# Patient Record
Sex: Male | Born: 1965 | Race: White | Hispanic: No | Marital: Married | Smoking: Current every day smoker
Health system: Southern US, Community
[De-identification: ages and names within clinical notes are randomized; demographics above are authoritative.]

## PROBLEM LIST (undated history)

## (undated) DIAGNOSIS — R413 Other amnesia: Secondary | ICD-10-CM

## (undated) DIAGNOSIS — J45909 Unspecified asthma, uncomplicated: Secondary | ICD-10-CM

## (undated) DIAGNOSIS — T7840XA Allergy, unspecified, initial encounter: Secondary | ICD-10-CM

## (undated) DIAGNOSIS — F32A Depression, unspecified: Secondary | ICD-10-CM

## (undated) DIAGNOSIS — G43909 Migraine, unspecified, not intractable, without status migrainosus: Secondary | ICD-10-CM

## (undated) DIAGNOSIS — E785 Hyperlipidemia, unspecified: Secondary | ICD-10-CM

## (undated) DIAGNOSIS — J439 Emphysema, unspecified: Secondary | ICD-10-CM

## (undated) HISTORY — DX: Hyperlipidemia, unspecified: E78.5

## (undated) HISTORY — DX: Depression, unspecified: F32.A

## (undated) HISTORY — DX: Allergy, unspecified, initial encounter: T78.40XA

## (undated) HISTORY — DX: Other amnesia: R41.3

## (undated) HISTORY — PX: OTHER SURGICAL HISTORY: SHX169

## (undated) HISTORY — PX: NASAL SINUS SURGERY: SHX719

## (undated) HISTORY — PX: COLONOSCOPY: SHX174

## (undated) HISTORY — PX: HAND SURGERY: SHX662

## (undated) HISTORY — DX: Emphysema, unspecified: J43.9

## (undated) HISTORY — DX: Migraine, unspecified, not intractable, without status migrainosus: G43.909

## (undated) HISTORY — DX: Unspecified asthma, uncomplicated: J45.909

---

## 2017-11-29 DIAGNOSIS — I639 Cerebral infarction, unspecified: Secondary | ICD-10-CM | POA: Insufficient documentation

## 2017-11-29 HISTORY — DX: Cerebral infarction, unspecified: I63.9

## 2021-12-21 ENCOUNTER — Ambulatory Visit (INDEPENDENT_AMBULATORY_CARE_PROVIDER_SITE_OTHER): Payer: Managed Care, Other (non HMO) | Admitting: Medical

## 2021-12-21 ENCOUNTER — Encounter: Payer: Self-pay | Admitting: Medical

## 2021-12-21 VITALS — BP 137/86 | HR 85 | Temp 96.4°F | Resp 18 | Ht 74.0 in | Wt 248.4 lb

## 2021-12-21 DIAGNOSIS — R4701 Aphasia: Secondary | ICD-10-CM

## 2021-12-21 DIAGNOSIS — Z0000 Encounter for general adult medical examination without abnormal findings: Secondary | ICD-10-CM

## 2021-12-21 DIAGNOSIS — Z125 Encounter for screening for malignant neoplasm of prostate: Secondary | ICD-10-CM | POA: Diagnosis not present

## 2021-12-21 DIAGNOSIS — E785 Hyperlipidemia, unspecified: Secondary | ICD-10-CM

## 2021-12-21 DIAGNOSIS — Z122 Encounter for screening for malignant neoplasm of respiratory organs: Secondary | ICD-10-CM

## 2021-12-21 DIAGNOSIS — R5383 Other fatigue: Secondary | ICD-10-CM

## 2021-12-21 DIAGNOSIS — N529 Male erectile dysfunction, unspecified: Secondary | ICD-10-CM

## 2021-12-21 DIAGNOSIS — F17200 Nicotine dependence, unspecified, uncomplicated: Secondary | ICD-10-CM

## 2021-12-21 DIAGNOSIS — Z8673 Personal history of transient ischemic attack (TIA), and cerebral infarction without residual deficits: Secondary | ICD-10-CM

## 2021-12-21 DIAGNOSIS — G8929 Other chronic pain: Secondary | ICD-10-CM

## 2021-12-21 DIAGNOSIS — R519 Headache, unspecified: Secondary | ICD-10-CM

## 2021-12-21 DIAGNOSIS — E559 Vitamin D deficiency, unspecified: Secondary | ICD-10-CM | POA: Diagnosis not present

## 2021-12-21 DIAGNOSIS — Z1211 Encounter for screening for malignant neoplasm of colon: Secondary | ICD-10-CM

## 2021-12-21 DIAGNOSIS — K2289 Other specified disease of esophagus: Secondary | ICD-10-CM

## 2021-12-21 MED ORDER — ROSUVASTATIN CALCIUM 20 MG PO TABS: 20.0000 mg | ORAL_TABLET | Freq: Every day | ORAL | 3 refills | Status: DC

## 2021-12-21 NOTE — Patient Instructions (Addendum)
For you wellness exam today I have ordered cbc, cmp and lipid panel.  Screening psa.  For fatigue added fatigue labs.  Referral for colonoscopy.  Placed ct chest screening for lung cancer.   Will place neurology referral for history of stoke and for migraine ha.  Refer to cardiologist for CAD and high cholesterol.  Recommend stop smoking.  Recommend exercise and healthy diet.  We will let you know lab results as they come in.  For ED rx'd viagra.  Follow up date appointment will be determined after lab review.     Preventive Care 56-47 Years Old, Male Preventive care refers to lifestyle choices and visits with your health care provider that can promote health and wellness. Preventive care visits are also called wellness exams. What can I expect for my preventive care visit? Counseling During your preventive care visit, your health care provider may ask about your: Medical history, including: Past medical problems. Family medical history. Current health, including: Emotional well-being. Home life and relationship well-being. Sexual activity. Lifestyle, including: Alcohol, nicotine or tobacco, and drug use. Access to firearms. Diet, exercise, and sleep habits. Safety issues such as seatbelt and bike helmet use. Sunscreen use. Work and work Statistician. Physical exam Your health care provider will check your: Height and weight. These may be used to calculate your BMI (body mass index). BMI is a measurement that tells if you are at a healthy weight. Waist circumference. This measures the distance around your waistline. This measurement also tells if you are at a healthy weight and may help predict your risk of certain diseases, such as type 2 diabetes and high blood pressure. Heart rate and blood pressure. Body temperature. Skin for abnormal spots. What immunizations do I need? Vaccines are usually given at various ages, according to a schedule. Your health care  provider will recommend vaccines for you based on your age, medical history, and lifestyle or other factors, such as travel or where you work. What tests do I need? Screening Your health care provider may recommend screening tests for certain conditions. This may include: Lipid and cholesterol levels. Diabetes screening. This is done by checking your blood sugar (glucose) after you have not eaten for a while (fasting). Hepatitis B test. Hepatitis C test. HIV (human immunodeficiency virus) test. STI (sexually transmitted infection) testing, if you are at risk. Lung cancer screening. Prostate cancer screening. Colorectal cancer screening. Talk with your health care provider about your test results, treatment options, and if necessary, the need for more tests. Follow these instructions at home: Eating and drinking  Eat a diet that includes fresh fruits and vegetables, whole grains, lean protein, and low-fat dairy products. Take vitamin and mineral supplements as recommended by your health care provider. Do not drink alcohol if your health care provider tells you not to drink. If you drink alcohol: Limit how much you have to 0-2 drinks a day. Know how much alcohol is in your drink. In the [place] one drink equals one 12 oz bottle of beer (355 mL), one 5 oz glass of wine (148 mL), or one 1 oz glass of hard liquor (44 mL). Lifestyle Brush your teeth every morning and night with fluoride toothpaste. Floss one time each day. Exercise for at least 30 minutes 5 or more days each week. Do not use any products that contain nicotine or tobacco. These products include cigarettes, chewing tobacco, and vaping devices, such as e-cigarettes. If you need help quitting, ask your health care provider. Do not use  drugs. If you are sexually active, practice safe sex. Use a condom or other form of protection to prevent STIs. Take aspirin only as told by your health care provider. Make sure that you understand  how much to take and what form to take. Work with your health care provider to find out whether it is safe and beneficial for you to take aspirin daily. Find healthy ways to manage stress, such as: Meditation, yoga, or listening to music. Journaling. Talking to a trusted person. Spending time with friends and family. Minimize exposure to UV radiation to reduce your risk of skin cancer. Safety Always wear your seat belt while driving or riding in a vehicle. Do not drive: If you have been drinking alcohol. Do not ride with someone who has been drinking. When you are tired or distracted. While texting. If you have been using any mind-altering substances or drugs. Wear a helmet and other protective equipment during sports activities. If you have firearms in your house, make sure you follow all gun safety procedures. What's next? Go to your health care provider once a year for an annual wellness visit. Ask your health care provider how often you should have your eyes and teeth checked. Stay up to date on all vaccines. This information is not intended to replace advice given to you by your health care provider. Make sure you discuss any questions you have with your health care provider. Document Revised: 05/13/2021 Document Reviewed: 05/13/2021 Elsevier Patient Education  [place].

## 2021-12-21 NOTE — Progress Notes (Signed)
Subjective:    Patient ID: Lee Andrews, male    DOB: July 31, 1966, 56 y.o.   MRN: [id number]  HPI  Pt in for first. Pt new to area. Moved from  [place].   Pt in for first time. He has hx of stroke 3 years ago. Pt got ha around that time. He states got migraine like featurs to HA around that time. No migraine ha in youth.  Pt had imaging studies with neurologist in past. He had left basal ganglion area stroke. Pt needs new neurologist. Pt saw neurologist for stroke and  different ha specialist.    Lamonte Sakai that are daily are the same over past 3 years. Tried botox for a while and did not help.  Pt also of nortriptyline for mood and migraine. Pt has no  been out of emgality since oct or November. He had been on emgality 6 months before the move. It did decrease intensity of ha and frequency but still had daily. Still some light and sound sensitivity.  Pt has some left side  hand tremors over 2.5 years or so.  Pt is on disability.   Hyperlipidemia- he on crestor. Pt had cardiologist. He had catherization in the past. Some cad but no need for procdure per wife.   Review of Systems  Constitutional:  Negative for chills, fatigue and fever.  Respiratory:  Negative for chest tightness, shortness of breath and wheezing.   Cardiovascular:  Negative for chest pain and palpitations.  Gastrointestinal:  Negative for abdominal pain.  Genitourinary:  Negative for difficulty urinating and flank pain.       ED. No prior use nitro. No adverse side effects. Wife states he needs refill.  Musculoskeletal:  Negative for back pain and joint swelling.  Skin:  Negative for rash.  Neurological:  Positive for headaches. Negative for dizziness, tremors, syncope, weakness and light-headedness.       Daily ha for years.  Psychiatric/Behavioral:  Positive for dysphoric mood. Negative for confusion, hallucinations and sleep disturbance. The patient is not nervous/anxious.        Mild depression daily ha.    No past  medical history on file.   Social History   Socioeconomic History   Marital status: Married    Spouse name: Not on file   Number of children: Not on file   Years of education: Not on file   Highest education level: Not on file  Occupational History   Not on file  Tobacco Use   Smoking status: Not on file   Smokeless tobacco: Not on file  Substance and Sexual Activity   Alcohol use: Not on file   Drug use: Not on file   Sexual activity: Not on file  Other Topics Concern   Not on file  Social History Narrative   Not on file   Social Determinants of Health   Financial Resource Strain: Not on file  Food Insecurity: Not on file  Transportation Needs: Not on file  Physical Activity: Not on file  Stress: Not on file  Social Connections: Not on file  Intimate Partner Violence: Not on file      No family history on file.  Not on File  Current Outpatient Medications on File Prior to Visit  Medication Sig Dispense Refill   EMGALITY 120 MG/ML SOAJ Inject 1 mL into the skin every 30 (thirty) days.     nortriptyline (PAMELOR) 50 MG capsule Take 50 mg by mouth at bedtime.     rosuvastatin (  CRESTOR) 20 MG tablet Take 20 mg by mouth daily.     No current facility-administered medications on file prior to visit.    BP 137/86    Pulse 85    Temp (!) 96.4 F (35.8 C) (Temporal)    Resp 18    Ht 6\' 2"  (1.88 m)    Wt 248 lb 6.4 oz (112.7 kg)    SpO2 97%    BMI 31.89 kg/m       Objective:   Physical Exam  General Mental Status- Alert. General Appearance- Not in acute distress.   Skin General: Color- Normal Color. Moisture- Normal Moisture.  Neck Carotid Arteries- Normal color. Moisture- Normal Moisture. No carotid bruits. No JVD.  Chest and Lung Exam Auscultation: Breath Sounds:-Normal.  Cardiovascular Auscultation:Rythm- Regular. Murmurs & Other Heart Sounds:Auscultation of the heart reveals- No Murmurs.  Abdomen Inspection:-Inspeection  Normal. Palpation/Percussion:Note:No mass. Palpation and Percussion of the abdomen reveal- Non Tender, Non Distended + BS, no rebound or guarding.    Neurologic Cranial Nerve exam:- CN III-XII intact(No nystagmus), symmetric smile. Drift Test:- No drift. Romberg Exam:- Negative.  Heal to Toe Gait exam:-Normal. Finger to Nose:- Normal/Intact Strength:- 5/5 equal and symmetric strength both upper and lower extremities.   Left upper ext- mild shaking/tremor.     Assessment & Plan:   For you wellness exam today I have ordered cbc, cmp and lipid panel.  Screening psa.  For fatigue added fatigue labs.  Referral for colonoscopy.  Placed ct chest screening for lung cancer.  Will place neurology referral for history of stoke and for migraine ha.  Refer to cardiologist for CAD and high cholesterol.  Recommend stop smoking.  Recommend exercise and healthy diet.  We will let you know lab results as they come in.  Follow up date appointment will be determined after lab review.   Mackie Pai, PA-C

## 2021-12-22 ENCOUNTER — Telehealth: Payer: Self-pay | Admitting: Medical

## 2021-12-22 LAB — LIPID PANEL
Cholesterol: 202 mg/dL — ABNORMAL HIGH (ref 0–200)
HDL: 44.5 mg/dL (ref >39.00)
LDL Cholesterol: 128 mg/dL — ABNORMAL HIGH (ref 0–99)
NonHDL: 157.76
Total CHOL/HDL Ratio: 5
Triglycerides: 148 mg/dL (ref 0.0–149.0)
VLDL: 29.6 mg/dL (ref 0.0–40.0)

## 2021-12-22 LAB — COMPREHENSIVE METABOLIC PANEL
ALT: 23 U/L (ref 0–53)
AST: 18 U/L (ref 0–37)
Albumin: 4.5 g/dL (ref 3.5–5.2)
Alkaline Phosphatase: 82 U/L (ref 39–117)
BUN: 12 mg/dL (ref 6–23)
CO2: 28 mEq/L (ref 19–32)
Calcium: 9.9 mg/dL (ref 8.4–10.5)
Chloride: 101 mEq/L (ref 96–112)
Creatinine, Ser: 0.62 mg/dL (ref 0.40–1.50)
GFR: 107.89 mL/min (ref >60.00)
Glucose, Bld: 82 mg/dL (ref 70–99)
Potassium: 5.2 mEq/L — ABNORMAL HIGH (ref 3.5–5.1)
Sodium: 137 mEq/L (ref 135–145)
Total Bilirubin: 0.6 mg/dL (ref 0.2–1.2)
Total Protein: 7 g/dL (ref 6.0–8.3)

## 2021-12-22 LAB — TSH: TSH: 1.51 u[IU]/mL (ref 0.35–5.50)

## 2021-12-22 LAB — CBC WITH DIFFERENTIAL/PLATELET
Basophils Absolute: 0.1 10*3/uL (ref 0.0–0.1)
Basophils Relative: 0.8 % (ref 0.0–3.0)
Eosinophils Absolute: 0.2 10*3/uL (ref 0.0–0.7)
Eosinophils Relative: 1.6 % (ref 0.0–5.0)
HCT: 46.5 % (ref 39.0–52.0)
Hemoglobin: 15.2 g/dL (ref 13.0–17.0)
Lymphocytes Relative: 22.3 % (ref 12.0–46.0)
Lymphs Abs: 2.5 10*3/uL (ref 0.7–4.0)
MCHC: 32.7 g/dL (ref 30.0–36.0)
MCV: 90.7 fl (ref 78.0–100.0)
Monocytes Absolute: 0.7 10*3/uL (ref 0.1–1.0)
Monocytes Relative: 6.4 % (ref 3.0–12.0)
Neutro Abs: 7.7 10*3/uL (ref 1.4–7.7)
Neutrophils Relative %: 68.9 % (ref 43.0–77.0)
Platelets: 374 10*3/uL (ref 150.0–400.0)
RBC: 5.13 Mil/uL (ref 4.22–5.81)
RDW: 13.9 % (ref 11.5–15.5)
WBC: 11.2 10*3/uL — ABNORMAL HIGH (ref 4.0–10.5)

## 2021-12-22 LAB — VITAMIN B12: Vitamin B-12: 330 pg/mL (ref 211–911)

## 2021-12-22 LAB — T4, FREE: Free T4: 0.94 ng/dL (ref 0.60–1.60)

## 2021-12-22 LAB — PSA: PSA: 0.43 ng/mL (ref 0.10–4.00)

## 2021-12-22 MED ORDER — SILDENAFIL CITRATE 100 MG PO TABS
50.0000 mg | ORAL_TABLET | Freq: Every day | ORAL | 3 refills | Status: DC | PRN
Start: 2021-12-22 — End: 2021-12-22

## 2021-12-22 MED ORDER — SILDENAFIL CITRATE 100 MG PO TABS: 50.0000 mg | ORAL_TABLET | Freq: Every day | ORAL | 3 refills | Status: DC | PRN

## 2021-12-22 NOTE — Addendum Note (Signed)
Addended by: Anabel Halon on: 12/22/2021 11:44 AM   Modules accepted: Orders

## 2021-12-22 NOTE — Addendum Note (Signed)
Addended by: Jeronimo Greaves on: 12/22/2021 01:08 PM   Modules accepted: Orders

## 2021-12-22 NOTE — Telephone Encounter (Signed)
Printed off and placed up  front 

## 2021-12-22 NOTE — Addendum Note (Signed)
Addended by: Anabel Halon on: 12/22/2021 12:00 PM   Modules accepted: Orders

## 2021-12-22 NOTE — Telephone Encounter (Signed)
Various attempts to print. Mackie Pai, PA-C

## 2021-12-22 NOTE — Telephone Encounter (Signed)
I have tried to print pt viagra rx 3 times or more. Will you print the rx and give for me to sign. Then after signed place up front alphebetical for pt to pick up.

## 2021-12-24 LAB — VITAMIN D 1,25 DIHYDROXY
Vitamin D 1, 25 (OH)2 Total: 39 pg/mL (ref 18–72)
Vitamin D2 1, 25 (OH)2: <8 pg/mL
Vitamin D3 1, 25 (OH)2: 39 pg/mL

## 2021-12-25 LAB — VITAMIN B1: Vitamin B1 (Thiamine): 8 nmol/L (ref 8–30)

## 2021-12-30 ENCOUNTER — Telehealth: Payer: Self-pay | Admitting: Medical

## 2021-12-30 NOTE — Telephone Encounter (Signed)
Medical records copied and placed in scan , original copies placed at front desk

## 2021-12-30 NOTE — Telephone Encounter (Signed)
Patient spouse dropped off medical records to be scanned into patients chart.  Patient spouse will pick up original copies once completed.  Placed inside providers tray.

## 2022-01-04 ENCOUNTER — Telehealth: Payer: Self-pay | Admitting: Diagnostic Neuroimaging

## 2022-01-04 ENCOUNTER — Ambulatory Visit (INDEPENDENT_AMBULATORY_CARE_PROVIDER_SITE_OTHER): Payer: Managed Care, Other (non HMO) | Admitting: Diagnostic Neuroimaging

## 2022-01-04 ENCOUNTER — Encounter: Payer: Self-pay | Admitting: Diagnostic Neuroimaging

## 2022-01-04 VITALS — BP 134/84 | HR 86 | Ht 74.0 in | Wt 250.0 lb

## 2022-01-04 DIAGNOSIS — R259 Unspecified abnormal involuntary movements: Secondary | ICD-10-CM

## 2022-01-04 DIAGNOSIS — R4189 Other symptoms and signs involving cognitive functions and awareness: Secondary | ICD-10-CM | POA: Diagnosis not present

## 2022-01-04 DIAGNOSIS — G43109 Migraine with aura, not intractable, without status migrainosus: Secondary | ICD-10-CM

## 2022-01-04 MED ORDER — AJOVY 225 MG/1.5ML ~~LOC~~ SOAJ: 225.0000 mg | SUBCUTANEOUS | 6 refills | Status: DC

## 2022-01-04 MED ORDER — UBRELVY 50 MG PO TABS: 50.0000 mg | ORAL_TABLET | ORAL | 6 refills | Status: DC | PRN

## 2022-01-04 NOTE — Telephone Encounter (Signed)
cigna order sent to GI, they will obtain the auth and reach out to the patient to schedule.
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
## 2022-01-04 NOTE — Patient Instructions (Signed)
°  APHASIA, APRAXIA, OCULOMOTOR DYSFUNCTION, DYSTONIA - possible corticobasal degeneration - consider trial of carb/levo  HEADACHES (migraine with aura) - ajovy + Roselyn Meier

## 2022-01-05 ENCOUNTER — Other Ambulatory Visit: Payer: Self-pay

## 2022-01-05 ENCOUNTER — Telehealth: Payer: Self-pay

## 2022-01-05 ENCOUNTER — Ambulatory Visit (HOSPITAL_BASED_OUTPATIENT_CLINIC_OR_DEPARTMENT_OTHER)
Admission: RE | Admit: 2022-01-05 | Discharge: 2022-01-05 | Disposition: A | Discharge status: Home / Self Care | Payer: Managed Care, Other (non HMO) | Source: Ambulatory Visit | Attending: Medical | Admitting: Medical

## 2022-01-05 DIAGNOSIS — Z122 Encounter for screening for malignant neoplasm of respiratory organs: Secondary | ICD-10-CM | POA: Insufficient documentation

## 2022-01-05 NOTE — Telephone Encounter (Signed)
I have submitted a PA request for [place] [facility], [place] - PA Case ID: [id number]. Awaiting determination.

## 2022-01-06 ENCOUNTER — Encounter: Payer: Self-pay | Admitting: *Deleted

## 2022-01-06 ENCOUNTER — Telehealth: Payer: Self-pay | Admitting: *Deleted

## 2022-01-06 NOTE — Telephone Encounter (Signed)
Ajovy PA, key [id number], [id number]. have sent my chart asking patient what migraine meds he has tried/ failed. Only emgality  is listed.

## 2022-01-07 NOTE — Addendum Note (Signed)
Addended by: Anabel Halon on: 01/07/2022 01:39 PM   Modules accepted: Orders

## 2022-01-07 NOTE — Telephone Encounter (Signed)
I entered information re: botox that patient had provided.  Your information has been submitted and will be reviewed by [place].  An electronic determination will be received in CoverMyMeds within 72-120 hours. You will receive a fax copy of the determination. If Christella Scheuermann has not responded in 120 hours, contact [employer] at [phone].

## 2022-01-11 ENCOUNTER — Encounter: Payer: Self-pay | Admitting: *Deleted

## 2022-01-11 NOTE — Telephone Encounter (Signed)
Called [employer]a to check Ajovy PA, on hold 20 minutes, will have to call back.

## 2022-01-11 NOTE — Telephone Encounter (Signed)
[employer], on hold 20 minutes. I sent my chart asking patient to have pharmacy run Rx to see if it'll go through.

## 2022-01-13 NOTE — Telephone Encounter (Signed)
Opened in error

## 2022-01-15 DIAGNOSIS — E785 Hyperlipidemia, unspecified: Secondary | ICD-10-CM | POA: Insufficient documentation

## 2022-01-15 DIAGNOSIS — G43909 Migraine, unspecified, not intractable, without status migrainosus: Secondary | ICD-10-CM | POA: Insufficient documentation

## 2022-01-19 ENCOUNTER — Encounter: Payer: Self-pay | Admitting: Cardiology

## 2022-01-19 ENCOUNTER — Ambulatory Visit (INDEPENDENT_AMBULATORY_CARE_PROVIDER_SITE_OTHER): Payer: Managed Care, Other (non HMO) | Admitting: Cardiology

## 2022-01-19 ENCOUNTER — Other Ambulatory Visit: Payer: Self-pay

## 2022-01-19 VITALS — BP 118/80 | HR 91 | Ht 74.0 in | Wt 247.0 lb

## 2022-01-19 DIAGNOSIS — E782 Mixed hyperlipidemia: Secondary | ICD-10-CM

## 2022-01-19 DIAGNOSIS — F17200 Nicotine dependence, unspecified, uncomplicated: Secondary | ICD-10-CM | POA: Diagnosis not present

## 2022-01-19 DIAGNOSIS — I6930 Unspecified sequelae of cerebral infarction: Secondary | ICD-10-CM

## 2022-01-19 DIAGNOSIS — I2510 Atherosclerotic heart disease of native coronary artery without angina pectoris: Secondary | ICD-10-CM | POA: Diagnosis not present

## 2022-01-19 DIAGNOSIS — R079 Chest pain, unspecified: Secondary | ICD-10-CM

## 2022-01-19 DIAGNOSIS — I2584 Coronary atherosclerosis due to calcified coronary lesion: Secondary | ICD-10-CM

## 2022-01-19 NOTE — Patient Instructions (Signed)
Medication Instructions:  Your physician recommends that you continue on your current medications as directed. Please refer to the Current Medication list given to you today.  *If you need a refill on your cardiac medications before your next appointment, please call your pharmacy*   Lab Work: Your physician recommends that you return for lab work in:   Labs in 1 month: Lipids  If you have labs (blood work) drawn today and your tests are completely normal, you will receive your results only by: [place] (if you have [place]) OR A paper copy in the mail If you have any lab test that is abnormal or we need to change your treatment, we will call you to review the results.   Testing/Procedures: Your physician has requested that you have an echocardiogram. Echocardiography is a painless test that uses sound waves to create images of your heart. It provides your doctor with information about the size and shape of your heart and how well your hearts chambers and valves are working. This procedure takes approximately one hour. There are no restrictions for this procedure.  Your physician has requested that you have a carotid duplex. This test is an ultrasound of the carotid arteries in your neck. It looks at blood flow through these arteries that supply the brain with blood. Allow one hour for this exam. There are no restrictions or special instructions.     [facility] at [facility] [address] [place], [place] [address] Phone: [phone]    Please arrive 15 minutes prior to your appointment time for registration and insurance purposes.  The test will take approximately 3 to 4 hours to complete; you may bring reading material.  If someone comes with you to your appointment, they will need to remain in the main lobby due to limited space in the testing area. **If you are pregnant or breastfeeding, please notify the nuclear lab prior to  your appointment**  How to prepare for your Myocardial Perfusion Test: Do not eat or drink 3 hours prior to your test, except you may have water. Do not consume products containing caffeine (regular or decaffeinated) 12 hours prior to your test. (ex: coffee, chocolate, sodas, tea). Do bring a list of your current medications with you.  If not listed below, you may take your medications as normal. HOLD Erectile dysfunction medication: Viagra Do wear comfortable clothes (no dresses or overalls) and walking shoes, tennis shoes preferred (No heels or open toe shoes are allowed). Do NOT wear cologne, perfume, aftershave, or lotions (deodorant is allowed). If these instructions are not followed, your test will have to be rescheduled.  Please report to [place], [address] for your test.  If you have questions or concerns about your appointment, you can call the Nuclear Lab at [phone].  If you cannot keep your appointment, please provide 24 hours notification to the Nuclear Lab, to avoid a possible $50 charge to your account.    Follow-Up: At [facility], you and your health needs are our priority.  As part of our continuing mission to provide you with exceptional heart care, we have created designated Provider Care Teams.  These Care Teams include your primary Cardiologist (physician) and Advanced Practice Providers (APPs -  Physician Assistants and Nurse Practitioners) who all work together to provide you with the care you need, when you need it.  We recommend signing up for the patient portal called "MyChart".  Sign up information is provided on this  After Visit Summary.  MyChart is used to connect with patients for Virtual Visits (Telemedicine).  Patients are able to view lab/test results, encounter notes, upcoming appointments, etc.  Non-urgent messages can be sent to your provider as well.   To learn more about what you can do with MyChart, go to [email or web address].     Your next appointment:   3 month(s)  The format for your next appointment:   In Person  Provider:   Jenne Campus, MD    Other Instructions None

## 2022-01-19 NOTE — Progress Notes (Signed)
d 
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
## 2022-01-20 ENCOUNTER — Ambulatory Visit
Admission: RE | Admit: 2022-01-20 | Discharge: 2022-01-20 | Disposition: A | Discharge status: Home / Self Care | Payer: Managed Care, Other (non HMO) | Source: Ambulatory Visit | Attending: Diagnostic Neuroimaging | Admitting: Diagnostic Neuroimaging

## 2022-01-20 ENCOUNTER — Telehealth (HOSPITAL_COMMUNITY): Payer: Self-pay

## 2022-01-20 DIAGNOSIS — R4189 Other symptoms and signs involving cognitive functions and awareness: Secondary | ICD-10-CM

## 2022-01-20 DIAGNOSIS — R259 Unspecified abnormal involuntary movements: Secondary | ICD-10-CM

## 2022-01-20 MED ORDER — GADOBENATE DIMEGLUMINE 529 MG/ML IV SOLN
20.0000 mL | Freq: Once | INTRAVENOUS | Status: AC | PRN
  Administered 2022-01-20: 20 mL via INTRAVENOUS

## 2022-01-20 NOTE — Telephone Encounter (Signed)
Spoke with the patient, detailed instructions given. He stated that he would be here for his test. Asked to call back with any questions. S.Lezley Bedgood EMTP 

## 2022-01-22 ENCOUNTER — Other Ambulatory Visit: Payer: Self-pay

## 2022-01-22 ENCOUNTER — Ambulatory Visit (HOSPITAL_BASED_OUTPATIENT_CLINIC_OR_DEPARTMENT_OTHER)
Admission: RE | Admit: 2022-01-22 | Discharge: 2022-01-22 | Disposition: A | Discharge status: Home / Self Care | Payer: Managed Care, Other (non HMO) | Source: Ambulatory Visit | Attending: Cardiology | Admitting: Cardiology

## 2022-01-22 DIAGNOSIS — I6930 Unspecified sequelae of cerebral infarction: Secondary | ICD-10-CM | POA: Insufficient documentation

## 2022-01-22 DIAGNOSIS — R079 Chest pain, unspecified: Secondary | ICD-10-CM | POA: Diagnosis present

## 2022-01-22 DIAGNOSIS — I2584 Coronary atherosclerosis due to calcified coronary lesion: Secondary | ICD-10-CM | POA: Diagnosis present

## 2022-01-22 DIAGNOSIS — E782 Mixed hyperlipidemia: Secondary | ICD-10-CM | POA: Insufficient documentation

## 2022-01-22 DIAGNOSIS — I2510 Atherosclerotic heart disease of native coronary artery without angina pectoris: Secondary | ICD-10-CM | POA: Insufficient documentation

## 2022-01-22 DIAGNOSIS — F17200 Nicotine dependence, unspecified, uncomplicated: Secondary | ICD-10-CM | POA: Insufficient documentation

## 2022-01-22 LAB — ECHOCARDIOGRAM COMPLETE
AV Mean grad: 3 mmHg
AV Peak grad: 5.5 mmHg
Ao pk vel: 1.17 m/s
Area-P 1/2: 2.68 cm2
S' Lateral: 3.3 cm

## 2022-01-22 NOTE — Progress Notes (Signed)
°  Echocardiogram 2D Echocardiogram has been performed.  Lee Andrews F 01/22/2022, 11:12 AM

## 2022-01-25 NOTE — Telephone Encounter (Addendum)
Lee Andrews denied, must try triptans. Patient has history of stroke, cannot take triptans. PA will be resubmitted.

## 2022-01-25 NOTE — Telephone Encounter (Signed)
PA resubmission was successful received instant approval.  This request has been approved.  Please note any additional information provided by [employer] at the bottom of your screen. [id number];Review Type:Prior Auth;Coverage Start Date:01/25/2022;Coverage End Date:01/25/2023;

## 2022-01-26 ENCOUNTER — Ambulatory Visit (HOSPITAL_COMMUNITY): Payer: Managed Care, Other (non HMO) | Attending: Cardiovascular Disease

## 2022-01-26 ENCOUNTER — Other Ambulatory Visit: Payer: Self-pay

## 2022-01-26 DIAGNOSIS — R079 Chest pain, unspecified: Secondary | ICD-10-CM | POA: Insufficient documentation

## 2022-01-26 LAB — MYOCARDIAL PERFUSION IMAGING
LV dias vol: 74 mL (ref 62–150)
LV sys vol: 24 mL
Nuc Stress EF: 67 %
Peak HR: 94 {beats}/min
Rest HR: 78 {beats}/min
Rest Nuclear Isotope Dose: 10.2 mCi
SDS: 1
SRS: 0
SSS: 1
ST Depression (mm): 0 mm
Stress Nuclear Isotope Dose: 31.1 mCi
TID: 1.04

## 2022-01-26 MED ORDER — TECHNETIUM TC 99M TETROFOSMIN IV KIT
31.1000 | PACK | Freq: Once | INTRAVENOUS | Status: AC | PRN
  Administered 2022-01-26: 31.1 via INTRAVENOUS
  Filled 2022-01-26: qty 32

## 2022-01-26 MED ORDER — TECHNETIUM TC 99M TETROFOSMIN IV KIT
10.2000 | PACK | Freq: Once | INTRAVENOUS | Status: AC | PRN
  Administered 2022-01-26: 10.2 via INTRAVENOUS
  Filled 2022-01-26: qty 11

## 2022-01-26 MED ORDER — REGADENOSON 0.4 MG/5ML IV SOLN
0.4000 mg | Freq: Once | INTRAVENOUS | Status: AC
  Administered 2022-01-26: 0.4 mg via INTRAVENOUS

## 2022-01-28 ENCOUNTER — Telehealth: Payer: Self-pay

## 2022-01-28 NOTE — Telephone Encounter (Signed)
-----   Message from Park Liter, MD sent at 01/25/2022  2:21 PM EST ----- ?Echocardiogram showed normal left ventricle ejection fraction, left atrium is moderately dilated, overall looks good ?

## 2022-01-28 NOTE — Telephone Encounter (Signed)
Patient notified of results.

## 2022-01-28 NOTE — Telephone Encounter (Signed)
-----   Message from Park Liter, MD sent at 01/28/2022 10:52 AM EST ----- ?Stress test showing no ischemia ?

## 2022-02-05 ENCOUNTER — Other Ambulatory Visit: Payer: Self-pay

## 2022-02-05 ENCOUNTER — Ambulatory Visit (AMBULATORY_SURGERY_CENTER): Payer: Managed Care, Other (non HMO) | Admitting: *Deleted

## 2022-02-05 VITALS — Ht 74.0 in | Wt 250.0 lb

## 2022-02-05 DIAGNOSIS — Z1211 Encounter for screening for malignant neoplasm of colon: Secondary | ICD-10-CM

## 2022-02-05 MED ORDER — NA SULFATE-K SULFATE-MG SULF 17.5-3.13-1.6 GM/177ML PO SOLN: 1.0000 | Freq: Once | ORAL | 0 refills | Status: AC

## 2022-02-05 NOTE — Progress Notes (Signed)
No egg or soy allergy known to patient  °No issues known to pt with past sedation with any surgeries or procedures °Patient denies ever being told they had issues or difficulty with intubation  °No FH of Malignant Hyperthermia °Pt is not on diet pills °Pt is not on  home 02  °Pt is not on blood thinners  °Pt denies issues with constipation  °No A fib or A flutter ° °Pt is not vaccinated  for Covid  ° °Due to the COVID-19 pandemic we are asking patients to follow certain guidelines in PV and the [facility]   °Pt aware of COVID protocols and [facility] guidelines  ° °PV completed over the phone. Pt verified name, DOB, address and insurance during PV today.  °Pt mailed instruction packet with copy of consent form to read and not return, and instructions.  ° °Pt encouraged to call with questions or issues.  °If pt has My chart, procedure instructions sent via My Chart  ° °

## 2022-02-10 ENCOUNTER — Encounter: Payer: Self-pay | Admitting: Gastroenterology

## 2022-02-16 ENCOUNTER — Other Ambulatory Visit: Payer: Self-pay

## 2022-02-16 ENCOUNTER — Ambulatory Visit (HOSPITAL_COMMUNITY)
Admission: RE | Admit: 2022-02-16 | Discharge: 2022-02-16 | Disposition: A | Discharge status: Home / Self Care | Payer: Managed Care, Other (non HMO) | Source: Ambulatory Visit | Attending: Cardiovascular Disease | Admitting: Cardiovascular Disease

## 2022-02-16 DIAGNOSIS — Z8673 Personal history of transient ischemic attack (TIA), and cerebral infarction without residual deficits: Secondary | ICD-10-CM

## 2022-02-16 DIAGNOSIS — I6930 Unspecified sequelae of cerebral infarction: Secondary | ICD-10-CM | POA: Insufficient documentation

## 2022-02-16 DIAGNOSIS — I2584 Coronary atherosclerosis due to calcified coronary lesion: Secondary | ICD-10-CM | POA: Insufficient documentation

## 2022-02-16 DIAGNOSIS — F17200 Nicotine dependence, unspecified, uncomplicated: Secondary | ICD-10-CM | POA: Diagnosis present

## 2022-02-16 DIAGNOSIS — E782 Mixed hyperlipidemia: Secondary | ICD-10-CM | POA: Insufficient documentation

## 2022-02-16 DIAGNOSIS — R079 Chest pain, unspecified: Secondary | ICD-10-CM | POA: Diagnosis present

## 2022-02-16 DIAGNOSIS — I2510 Atherosclerotic heart disease of native coronary artery without angina pectoris: Secondary | ICD-10-CM | POA: Insufficient documentation

## 2022-02-19 ENCOUNTER — Encounter: Payer: Self-pay | Admitting: Gastroenterology

## 2022-02-19 ENCOUNTER — Ambulatory Visit (AMBULATORY_SURGERY_CENTER): Payer: Managed Care, Other (non HMO) | Admitting: Gastroenterology

## 2022-02-19 VITALS — BP 110/76 | HR 61 | Temp 98.3°F | Resp 12 | Ht 74.0 in | Wt 247.0 lb

## 2022-02-19 DIAGNOSIS — Z1211 Encounter for screening for malignant neoplasm of colon: Secondary | ICD-10-CM | POA: Diagnosis present

## 2022-02-19 DIAGNOSIS — D128 Benign neoplasm of rectum: Secondary | ICD-10-CM | POA: Diagnosis not present

## 2022-02-19 DIAGNOSIS — D123 Benign neoplasm of transverse colon: Secondary | ICD-10-CM

## 2022-02-19 DIAGNOSIS — D125 Benign neoplasm of sigmoid colon: Secondary | ICD-10-CM

## 2022-02-19 DIAGNOSIS — D120 Benign neoplasm of cecum: Secondary | ICD-10-CM | POA: Diagnosis not present

## 2022-02-19 DIAGNOSIS — D122 Benign neoplasm of ascending colon: Secondary | ICD-10-CM

## 2022-02-19 MED ORDER — SODIUM CHLORIDE 0.9 % IV SOLN: 500.0000 mL | INTRAVENOUS | Status: DC

## 2022-02-19 NOTE — Progress Notes (Signed)
PT taken to PACU. Monitors in place. VSS. Report given to RN. 
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
## 2022-02-19 NOTE — Progress Notes (Signed)
Called to room to assist during endoscopic procedure.  Patient ID and intended procedure confirmed with present staff. Received instructions for my participation in the procedure from the performing physician.  

## 2022-02-19 NOTE — Op Note (Signed)
[place] ?Patient Name: Lee Andrews ?Procedure Date: 02/19/2022 9:38 AM ?MRN: [id number] ?Endoscopist: Jackquline Denmark , MD ?Age: 56 ?Referring MD:  ?Date of Birth: 02/09/1966 ?Gender: Male ?Account #: [id number] ?Procedure:                Colonoscopy ?Indications:              Screening for colorectal malignant neoplasm ?Medicines:                Monitored Anesthesia Care ?Procedure:                Pre-Anesthesia Assessment: ?                          - Prior to the procedure, a History and Physical  ?                          was performed, and patient medications and  ?                          allergies were reviewed. The patient's tolerance of  ?                          previous anesthesia was also reviewed. The risks  ?                          and benefits of the procedure and the sedation  ?                          options and risks were discussed with the patient.  ?                          All questions were answered, and informed consent  ?                          was obtained. Prior Anticoagulants: The patient has  ?                          taken no previous anticoagulant or antiplatelet  ?                          agents. ASA Grade Assessment: II - A patient with  ?                          mild systemic disease. After reviewing the risks  ?                          and benefits, the patient was deemed in  ?                          satisfactory condition to undergo the procedure. ?                          After obtaining informed consent, the colonoscope  ?  was passed under direct vision. Throughout the  ?                          procedure, the patient's blood pressure, pulse, and  ?                          oxygen saturations were monitored continuously. The  ?                          CF [id number] #[id number] was introduced through the anus  ?                          and advanced to the the cecum, identified by  ?                          appendiceal orifice and  ileocecal valve. The  ?                          colonoscopy was performed without difficulty. The  ?                          patient tolerated the procedure well. The quality  ?                          of the bowel preparation was adequate to identify  ?                          polyps. Some retained stool in the colon. TI, IC  ?                          valve and rectum was photographed. ?Scope In: 9:42:35 AM ?Scope Out: 10:09:17 AM ?Scope Withdrawal Time: 0 hours 19 minutes 35 seconds  ?Total Procedure Duration: 0 hours 26 minutes 42 seconds  ?Findings:                 Five sessile polyps were found in the proximal  ?                          transverse colon (2, 1 cm each), proximal ascending  ?                          colon (2 polyps, 1.2 cm each, flat laterally  ?                          spreading, s/p underwater EMR using cold snare) and  ?                          cecum (1). The polyps were 6 to 12 mm in size.  ?                          These polyps were removed with a cold snare.  ?  Resection and retrieval were complete. ?                          Three sessile polyps were found in the rectum and  ?                          mid sigmoid colon. The polyps were 4 to 6 mm in  ?                          size. These polyps were removed with a cold snare.  ?                          Resection and retrieval were complete. Few small  ?                          hyperplastic appearing polyps were visualized in  ?                          the distal sigmoid colon and rectum, confirmed by  ?                          NBI. ?                          A few small-mouthed diverticula were found in the  ?                          sigmoid colon. ?                          Non-bleeding internal hemorrhoids were found during  ?                          retroflexion. The hemorrhoids were small and Grade  ?                          I (internal hemorrhoids that do not prolapse). ?Complications:             No immediate complications. ?Estimated Blood Loss:     Estimated blood loss: none. ?Impression:               - Five 6 to 12 mm polyps in the proximal transverse  ?                          colon, in the proximal ascending colon (s/p  ?                          underwater EMR) and in the cecum, removed with a  ?                          cold snare. Resected and retrieved. ?                          - Three 4 to 6 mm polyps in the rectum and in  the  ?                          mid sigmoid colon, removed with a cold snare.  ?                          Resected and retrieved. ?                          - Minimal sigmoid diverticulosis. ?                          - Non-bleeding internal hemorrhoids. ?Recommendation:           - Patient has a contact number available for  ?                          emergencies. The signs and symptoms of potential  ?                          delayed complications were discussed with the  ?                          patient. Return to normal activities tomorrow.  ?                          Written discharge instructions were provided to the  ?                          patient. ?                          - Resume previous diet. ?                          - Continue present medications. ?                          - Await pathology results. ?                          - No aspirin, ibuprofen, naproxen, or other  ?                          non-steroidal anti-inflammatory drugs for 5 days. ?                          - Repeat colonoscopy in 6 months- 1 year for  ?                          surveillance with 2-day prep. ?                          - The findings and recommendations were discussed  ?                          with the patient's family. ?Jackquline Denmark, MD ?02/19/2022 10:17:51 AM ?This  report has been signed electronically. ?

## 2022-02-19 NOTE — Patient Instructions (Signed)
Resume previous diet and medications. Awaiting pathology results. No aspirin, ibuprofen, naproxen, or other non-steroidal anti-inflammatory drugs for 5 days. ?Repeat Colonoscopy in 6 months-1 year for surveillance with a 2 day prep. ? ?YOU HAD AN ENDOSCOPIC PROCEDURE TODAY AT [facility]:   Refer to the procedure report that was given to you for any specific questions about what was found during the examination.  If the procedure report does not answer your questions, please call your gastroenterologist to clarify.  If you requested that your care partner not be given the details of your procedure findings, then the procedure report has been included in a sealed envelope for you to review at your convenience later. ? ?YOU SHOULD EXPECT: Some feelings of bloating in the abdomen. Passage of more gas than usual.  Walking can help get rid of the air that was put into your GI tract during the procedure and reduce the bloating. If you had a lower endoscopy (such as a colonoscopy or flexible sigmoidoscopy) you may notice spotting of blood in your stool or on the toilet paper. If you underwent a bowel prep for your procedure, you may not have a normal bowel movement for a few days. ? ?Please Note:  You might notice some irritation and congestion in your nose or some drainage.  This is from the oxygen used during your procedure.  There is no need for concern and it should clear up in a day or so. ? ?SYMPTOMS TO REPORT IMMEDIATELY: ? ?Following lower endoscopy (colonoscopy or flexible sigmoidoscopy): ? Excessive amounts of blood in the stool ? Significant tenderness or worsening of abdominal pains ? Swelling of the abdomen that is new, acute ? Fever of 100?F or higher ? ?For urgent or emergent issues, a gastroenterologist can be reached at any hour by calling [phone]. ?Do not use MyChart messaging for urgent concerns.  ? ? ?DIET:  We do recommend a small meal at first, but then you may proceed to your  regular diet.  Drink plenty of fluids but you should avoid alcoholic beverages for 24 hours. ? ?ACTIVITY:  You should plan to take it easy for the rest of today and you should NOT DRIVE or use heavy machinery until tomorrow (because of the sedation medicines used during the test).   ? ?FOLLOW UP: ?Our staff will call the number listed on your records 48-72 hours following your procedure to check on you and address any questions or concerns that you may have regarding the information given to you following your procedure. If we do not reach you, we will leave a message.  We will attempt to reach you two times.  During this call, we will ask if you have developed any symptoms of COVID 19. If you develop any symptoms (ie: fever, flu-like symptoms, shortness of breath, cough etc.) before then, please call [phone].  If you test positive for Covid 19 in the 2 weeks post procedure, please call and report this information to [place].   ? ?If any biopsies were taken you will be contacted by phone or by letter within the next 1-3 weeks.  Please call us at [phone] if you have not heard about the biopsies in 3 weeks.  ? ? ?SIGNATURES/CONFIDENTIALITY: ?You and/or your care partner have signed paperwork which will be entered into your electronic medical record.  These signatures attest to the fact that that the information above on your After Visit Summary has been reviewed and is understood.  Full  responsibility of the confidentiality of this discharge information lies with you and/or your care-partner.  ?

## 2022-02-23 ENCOUNTER — Telehealth: Payer: Self-pay

## 2022-02-23 NOTE — Telephone Encounter (Signed)
Left message on answering machine. 

## 2022-02-25 ENCOUNTER — Encounter: Payer: Self-pay | Admitting: Gastroenterology

## 2022-02-25 ENCOUNTER — Telehealth: Payer: Self-pay

## 2022-02-25 NOTE — Telephone Encounter (Signed)
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

## 2022-02-25 NOTE — Telephone Encounter (Signed)
Unable to reach the patient, as a last attempt\, a letter requesting a call back has been mailed.  ?

## 2022-03-02 ENCOUNTER — Telehealth: Payer: Self-pay | Admitting: Cardiology

## 2022-03-02 NOTE — Telephone Encounter (Signed)
Results reviewed with [name] per DPR as per Dr. Wendy Poet note.  [name] verbalized understanding and had no additional questions. ?Routed to PCP  ?

## 2022-03-02 NOTE — Telephone Encounter (Signed)
Pt's wife calling to get test results. Please advise ?

## 2022-03-12 ENCOUNTER — Ambulatory Visit (INDEPENDENT_AMBULATORY_CARE_PROVIDER_SITE_OTHER): Payer: Managed Care, Other (non HMO) | Admitting: Medical

## 2022-03-12 VITALS — BP 115/74 | HR 75 | Resp 18 | Ht 74.0 in | Wt 253.6 lb

## 2022-03-12 DIAGNOSIS — E875 Hyperkalemia: Secondary | ICD-10-CM

## 2022-03-12 DIAGNOSIS — R911 Solitary pulmonary nodule: Secondary | ICD-10-CM

## 2022-03-12 DIAGNOSIS — I6930 Unspecified sequelae of cerebral infarction: Secondary | ICD-10-CM | POA: Diagnosis not present

## 2022-03-12 DIAGNOSIS — E785 Hyperlipidemia, unspecified: Secondary | ICD-10-CM | POA: Diagnosis not present

## 2022-03-12 DIAGNOSIS — R4701 Aphasia: Secondary | ICD-10-CM | POA: Diagnosis not present

## 2022-03-12 DIAGNOSIS — R4189 Other symptoms and signs involving cognitive functions and awareness: Secondary | ICD-10-CM

## 2022-03-12 DIAGNOSIS — Z8673 Personal history of transient ischemic attack (TIA), and cerebral infarction without residual deficits: Secondary | ICD-10-CM | POA: Diagnosis not present

## 2022-03-12 DIAGNOSIS — K219 Gastro-esophageal reflux disease without esophagitis: Secondary | ICD-10-CM

## 2022-03-12 DIAGNOSIS — J432 Centrilobular emphysema: Secondary | ICD-10-CM

## 2022-03-12 DIAGNOSIS — R933 Abnormal findings on diagnostic imaging of other parts of digestive tract: Secondary | ICD-10-CM

## 2022-03-12 DIAGNOSIS — G249 Dystonia, unspecified: Secondary | ICD-10-CM

## 2022-03-12 DIAGNOSIS — R482 Apraxia: Secondary | ICD-10-CM

## 2022-03-12 DIAGNOSIS — R251 Tremor, unspecified: Secondary | ICD-10-CM

## 2022-03-12 DIAGNOSIS — G43109 Migraine with aura, not intractable, without status migrainosus: Secondary | ICD-10-CM

## 2022-03-12 LAB — COMPREHENSIVE METABOLIC PANEL
ALT: 18 U/L (ref 0–53)
AST: 16 U/L (ref 0–37)
Albumin: 4.5 g/dL (ref 3.5–5.2)
Alkaline Phosphatase: 80 U/L (ref 39–117)
BUN: 10 mg/dL (ref 6–23)
CO2: 29 mEq/L (ref 19–32)
Calcium: 9.6 mg/dL (ref 8.4–10.5)
Chloride: 100 mEq/L (ref 96–112)
Creatinine, Ser: 0.64 mg/dL (ref 0.40–1.50)
GFR: 106.69 mL/min (ref >60.00)
Glucose, Bld: 89 mg/dL (ref 70–99)
Potassium: 5.4 mEq/L — ABNORMAL HIGH (ref 3.5–5.1)
Sodium: 135 mEq/L (ref 135–145)
Total Bilirubin: 0.4 mg/dL (ref 0.2–1.2)
Total Protein: 6.8 g/dL (ref 6.0–8.3)

## 2022-03-12 LAB — LIPID PANEL
Cholesterol: 121 mg/dL (ref 0–200)
HDL: 46.8 mg/dL (ref >39.00)
LDL Cholesterol: 54 mg/dL (ref 0–99)
NonHDL: 74.12
Total CHOL/HDL Ratio: 3
Triglycerides: 103 mg/dL (ref 0.0–149.0)
VLDL: 20.6 mg/dL (ref 0.0–40.0)
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
## 2022-03-14 NOTE — Patient Instructions (Addendum)
Copd by CT and recommended to repeat ct in 6 months. I do think best to refer you to pulmonologist for further evaluation and treatment. PFT studies. ? ? ?Gerd with abnormal thickening of esophagus on CT report. Referral  to GI  MD placed.  ? ?Hyperlipidemia- continue crestor. Lipid panel order placed. ? ?For  daily moderate to severe ha follow up with neurologist. Let neurologist know that meds he has prescribed are not working well.  ? ?APHASIA, APRAXIA, OCULOMOTOR DYSFUNCTION, DYSTONIA (since 2019) ?- possible corticobasal degeneration or other neurodegenerative process ?- left putamen stroke does not explain constellation of symptoms ?- consider trial of carb/levo ? ? ?Hx of stroke with persisting deficits. This along with other chronic problems support his permanent disability. I will fill out forms but also giving copies of the forms for neurologist to fill out as well.  ? ?Follow up 3 months or sooner if needed. ?

## 2022-03-17 ENCOUNTER — Encounter: Payer: Self-pay | Admitting: Medical

## 2022-03-23 ENCOUNTER — Telehealth: Payer: Self-pay

## 2022-03-23 ENCOUNTER — Other Ambulatory Visit (HOSPITAL_BASED_OUTPATIENT_CLINIC_OR_DEPARTMENT_OTHER): Payer: Self-pay

## 2022-03-23 MED ORDER — SODIUM POLYSTYRENE SULFONATE 15 GM/60ML PO SUSP
ORAL | 0 refills | Status: DC
  Filled 2022-03-23: qty 240, 4d supply, fill #0

## 2022-03-23 NOTE — Telephone Encounter (Signed)
Pt wife called stating kayexalate was never sent in ? ? ?They want the medication sent to [facility] on [address] ?

## 2022-03-23 NOTE — Telephone Encounter (Signed)
[facility] only has the generic brand , which comes in a powder or suspension  ? ?Made wife aware that [facility] doesn't carry medication and that she can pick the script up tomorrow at [facility] ?

## 2022-03-23 NOTE — Addendum Note (Signed)
Addended by: Anabel Halon on: 03/23/2022 03:24 PM ? ? Modules accepted: Orders ? ?

## 2022-03-23 NOTE — Addendum Note (Signed)
Addended by: Anabel Halon on: 03/23/2022 03:29 PM ? ? Modules accepted: Orders ? ?

## 2022-03-24 ENCOUNTER — Other Ambulatory Visit (HOSPITAL_BASED_OUTPATIENT_CLINIC_OR_DEPARTMENT_OTHER): Payer: Self-pay

## 2022-03-31 ENCOUNTER — Encounter: Payer: Self-pay | Admitting: Emergency Medicine

## 2022-03-31 ENCOUNTER — Ambulatory Visit (INDEPENDENT_AMBULATORY_CARE_PROVIDER_SITE_OTHER): Payer: Managed Care, Other (non HMO) | Admitting: Emergency Medicine

## 2022-03-31 VITALS — BP 138/80 | HR 75 | Temp 97.5°F | Ht 74.0 in | Wt 252.2 lb

## 2022-03-31 DIAGNOSIS — R911 Solitary pulmonary nodule: Secondary | ICD-10-CM

## 2022-03-31 DIAGNOSIS — J449 Chronic obstructive pulmonary disease, unspecified: Secondary | ICD-10-CM | POA: Diagnosis not present

## 2022-03-31 DIAGNOSIS — F17200 Nicotine dependence, unspecified, uncomplicated: Secondary | ICD-10-CM | POA: Diagnosis not present

## 2022-03-31 DIAGNOSIS — R918 Other nonspecific abnormal finding of lung field: Secondary | ICD-10-CM

## 2022-03-31 NOTE — Assessment & Plan Note (Signed)
Noted on lung cancer screening CT from 01/05/2022.  He is from [place], consider coccidiomycosis.  No documented history of valley fever.  Recommendation was a repeat CT in August 2023 and we will arrange for this. ?

## 2022-03-31 NOTE — Progress Notes (Signed)
? ?Subjective:  ? ? Patient ID: Lee Andrews, male    DOB: 18-Sep-1966, 56 y.o.   MRN: [id number] ? ?HPI ?56 year old man with a history of tobacco use (50 pack years) 1.5-2 pk/day, seasonal allergies, childhood asthma, migraines, history of CVA.  He is referred today for evaluation of emphysema, pulmonary nodular disease noted on lung cancer screening CT chest done 01/05/2022.  ? ?Moved from [place] in the last year. He has been experiencing more nasal congestion, drainage. Lots more cough and throat clearing - clear mucous. Has not needed any BD since he was a child. He gets SOB with exertion, inclines, stairs. He can intermittently hear wheeze. Gets bronchitis frequently.   ? ?Low-dose CT chest 01/05/2022 reviewed by me showed centrilobular and paraseptal emphysema with changes consistent with bronchiolitis, pulmonary nodules that measure up to 7.4 mm in the inferior lateral right lower lobe.  This was read as a lung RADS 3 study with plans to repeat in 6 months (August 2023) ? ? ?Review of Systems ?As per HPI ? ? ?Past Medical History:  ?Diagnosis Date  ? Allergy   ? SEASONAL  ? Asthma   ? "younger grew out of it"  ? Depression   ? "SOMETIMES"  ? Emphysema of lung ([place])   ? Hyperlipidemia   ? Migraine   ? Stroke [facility]) 2019  ?  ? ?Family History  ?Problem Relation Age of Onset  ? Heart disease Mother   ? Colon cancer Neg Hx   ? Colon polyps Neg Hx   ? Esophageal cancer Neg Hx   ? Rectal cancer Neg Hx   ? Stomach cancer Neg Hx   ?  ? ?Social History  ? ?Socioeconomic History  ? Marital status: Married  ?  Spouse name: [name]  ? Number of children: Not on file  ? Years of education: Not on file  ? Highest education level: Some college, no degree  ?Occupational History  ? Not on file  ?Tobacco Use  ? Smoking status: Every Day  ?  Packs/day: 1.00  ?  Years: 35.00  ?  Pack years: 35.00  ?  Types: Cigarettes  ?  Passive exposure: Never  ? Smokeless tobacco: Never  ? Tobacco comments:  ?  1.5 packs smoked daily ARJ 03/31/22   ?Vaping Use  ? Vaping Use: Never used  ?Substance and Sexual Activity  ? Alcohol use: Yes  ?  Comment: 6 pack every 2 weeks spreadout,BEER  ? Drug use: Never  ? Sexual activity: Yes  ?Other Topics Concern  ? Not on file  ?Social History Narrative  ? Lives with wife  ? Caffeine- coffee, 2L diet pepsi  ? ?Social Determinants of Health  ? ?Financial Resource Strain: Not on file  ?Food Insecurity: Not on file  ?Transportation Needs: Not on file  ?Physical Activity: Not on file  ?Stress: Not on file  ?Social Connections: Not on file  ?Intimate Partner Violence: Not on file  ?  ?From [place].  ?Has worked Architect.  ?Had a swamp cooler growing up, then [facility] ? ? ?No Known Allergies  ? ?Outpatient Medications Prior to Visit  ?Medication Sig Dispense Refill  ? aspirin 325 MG tablet Take 325 mg by mouth daily.    ? chlorhexidine (PERIDEX) 0.12 % solution 15 mLs 2 (two) times daily.    ? Fremanezumab-vfrm (AJOVY) 225 MG/1.5ML SOAJ Inject 225 mg into the skin every 30 (thirty) days. 1.68 mL 6  ? ibuprofen (ADVIL) 800 MG tablet Take 800  mg by mouth 3 (three) times daily.    ? nortriptyline (PAMELOR) 50 MG capsule Take 50 mg by mouth at bedtime.    ? rosuvastatin (CRESTOR) 20 MG tablet Take 1 tablet (20 mg total) by mouth daily. 90 tablet 3  ? sildenafil (VIAGRA) 100 MG tablet Take 0.5-1 tablets (50-100 mg total) by mouth daily as needed for erectile dysfunction. 10 tablet 3  ? sodium polystyrene (KAYEXALATE) 15 GM/60ML suspension Take 60 ml by mouth daily for 4 days. 240 mL 0  ? traMADol (ULTRAM) 50 MG tablet Take 50 mg by mouth every 8 (eight) hours as needed.    ? Ubrogepant (UBRELVY) 50 MG TABS Take 50 mg by mouth as needed. May repeat x 1 tab after 2 hours; max 2 tabs per day or 8 per month 8 tablet 6  ? ?No facility-administered medications prior to visit.  ? ? ? ? ?   ?Objective:  ? Physical Exam ?Vitals:  ? 03/31/22 1037  ?BP: 138/80  ?Pulse: 75  ?Temp: (!) 97.5 ?F (36.4 ?C)  ?TempSrc: Oral  ?SpO2: 99%  ?Weight: 252 lb 3.2  oz (114.4 kg)  ?Height: '6\' 2"'$  (1.88 m)  ? ?Gen: Pleasant, well-nourished, in no distress,  normal affect ? ?ENT: No lesions,  mouth clear,  oropharynx clear, no postnasal drip ? ?Neck: No JVD, no stridor ? ?Lungs: No use of accessory muscles, no crackles or wheezing on normal respiration, no wheeze on forced expiration ? ?Cardiovascular: RRR, heart sounds normal, no murmur or gallops, no peripheral edema ? ?Musculoskeletal: No deformities, no cyanosis or clubbing ? ?Neuro: alert, awake, non focal ? ?Skin: Warm, no lesions or rash ? ? ?   ?Assessment & Plan:  ?Pulmonary nodules ?Noted on lung cancer screening CT from 01/05/2022.  He is from [place], consider coccidiomycosis.  No documented history of valley fever.  Recommendation was a repeat CT in August 2023 and we will arrange for this. ? ?COPD (chronic obstructive pulmonary disease) ([place]) ?Based on his history tobacco use, emphysematous change on CT, bronchiolitis on CT, history of exacerbations in the past.  He does have daily symptoms.  We will quantify his degree of obstruction with pulmonary function testing.  Check an alpha-1 antitrypsin.  We will plan to start him on bronchodilator therapy next visit. ? ?Smoking ?Discussed strategy for cutting down, cessation with him today.  For starters we will try to decrease number down to a reasonable level from which he may be able to set a quit date.  We agreed that he will get down to 30 cigarettes daily by our next visit. ? ? ?Baltazar Apo, MD, PhD ?03/31/2022, 1:50 PM ?[facility] ?[phone] or if no answer before 7:00PM call [phone] ?For any issues after 7:00PM please call eLink [phone] ? ? ?

## 2022-03-31 NOTE — Assessment & Plan Note (Signed)
Discussed strategy for cutting down, cessation with him today.  For starters we will try to decrease number down to a reasonable level from which he may be able to set a quit date.  We agreed that he will get down to 30 cigarettes daily by our next visit. ?

## 2022-03-31 NOTE — Patient Instructions (Signed)
We will plan to repeat your lung cancer screening CT in August 2023 to compare with priors. ?We will perform pulmonary function testing at your next office visit. ?Suspect you would benefit from initiating inhaled medication.  We will talk more about this next time after your PFT. ?Blood work today ?You will benefit from decreasing your cigarettes.  Ultimate goal will be to stop altogether.  We agreed today that you would get down to no more than 30 cigarettes daily.  We can talk about decreasing further at your follow-up. ?Follow with Dr. Lamonte Sakai next available with full pulmonary function testing on the same day.  ? ?

## 2022-03-31 NOTE — Assessment & Plan Note (Signed)
Based on his history tobacco use, emphysematous change on CT, bronchiolitis on CT, history of exacerbations in the past.  He does have daily symptoms.  We will quantify his degree of obstruction with pulmonary function testing.  Check an alpha-1 antitrypsin.  We will plan to start him on bronchodilator therapy next visit. ?

## 2022-04-01 ENCOUNTER — Other Ambulatory Visit (INDEPENDENT_AMBULATORY_CARE_PROVIDER_SITE_OTHER): Payer: Managed Care, Other (non HMO)

## 2022-04-01 DIAGNOSIS — E875 Hyperkalemia: Secondary | ICD-10-CM | POA: Diagnosis not present

## 2022-04-01 LAB — COMPREHENSIVE METABOLIC PANEL
ALT: 20 U/L (ref 0–53)
AST: 15 U/L (ref 0–37)
Albumin: 4.6 g/dL (ref 3.5–5.2)
Alkaline Phosphatase: 79 U/L (ref 39–117)
BUN: 11 mg/dL (ref 6–23)
CO2: 29 mEq/L (ref 19–32)
Calcium: 9.8 mg/dL (ref 8.4–10.5)
Chloride: 102 mEq/L (ref 96–112)
Creatinine, Ser: 0.73 mg/dL (ref 0.40–1.50)
GFR: 102.5 mL/min (ref >60.00)
Glucose, Bld: 97 mg/dL (ref 70–99)
Potassium: 5.7 mEq/L — ABNORMAL HIGH (ref 3.5–5.1)
Sodium: 138 mEq/L (ref 135–145)
Total Bilirubin: 0.5 mg/dL (ref 0.2–1.2)
Total Protein: 7.1 g/dL (ref 6.0–8.3)

## 2022-04-02 ENCOUNTER — Other Ambulatory Visit: Payer: Managed Care, Other (non HMO)

## 2022-04-05 ENCOUNTER — Telehealth: Payer: Self-pay

## 2022-04-05 NOTE — Telephone Encounter (Signed)
Lee Andrews's wife called back in regards on 04/01/22 and patient did take KAYEXALATE ,   ? ?Wants to know what to do since patient's sodium increased even with medication ? ? ? ? ? ? ?[phone] ?

## 2022-04-06 NOTE — Telephone Encounter (Signed)
Pt wife called and lvm to return call ?

## 2022-04-07 ENCOUNTER — Other Ambulatory Visit: Payer: Self-pay | Admitting: Diagnostic Neuroimaging

## 2022-04-07 ENCOUNTER — Telehealth (INDEPENDENT_AMBULATORY_CARE_PROVIDER_SITE_OTHER): Payer: Managed Care, Other (non HMO) | Admitting: Medical

## 2022-04-07 ENCOUNTER — Other Ambulatory Visit (HOSPITAL_BASED_OUTPATIENT_CLINIC_OR_DEPARTMENT_OTHER): Payer: Self-pay

## 2022-04-07 DIAGNOSIS — Z8673 Personal history of transient ischemic attack (TIA), and cerebral infarction without residual deficits: Secondary | ICD-10-CM

## 2022-04-07 DIAGNOSIS — G3185 Corticobasal degeneration: Secondary | ICD-10-CM

## 2022-04-07 DIAGNOSIS — I6389 Other cerebral infarction: Secondary | ICD-10-CM

## 2022-04-07 DIAGNOSIS — E875 Hyperkalemia: Secondary | ICD-10-CM | POA: Diagnosis not present

## 2022-04-07 MED ORDER — SODIUM POLYSTYRENE SULFONATE 15 GM/60ML PO SUSP
15.0000 g | Freq: Every day | ORAL | 0 refills | Status: DC
  Filled 2022-04-07: qty 240, 4d supply, fill #0

## 2022-04-07 NOTE — Progress Notes (Signed)
? ?Subjective:  ? ? Patient ID: Lee Andrews, male    DOB: 1965/12/10, 56 y.o.   MRN: [id number] ? ?HPI ?Virtual Visit via Video Note ? ?I connected with Lee Andrews on 04/07/22 at  4:00 PM EDT by a video enabled telemedicine application and verified that I am speaking with the correct person using two identifiers. ? ?Location: ?Patient: home ?Provider: office ?  ?I discussed the limitations of evaluation and management by telemedicine and the availability of in person appointments. The patient expressed understanding and agreed to proceed. ? ?History of Present Illness: ? ? ?Pt with wife today on video visit. Recent high potassium levels. In the past he had borderline potassium levels in the past in [place]. Usually in past 4.9 and 5.1.  ? ?On review he states not difficult stick. No hemolysis on recent labs.  ? ?On review 2 cups of coffee a day.. ? ?His most recent pstassium was 5.7. This was despite use of kayexalate for 5 days. No obvious high potassium foods. Does drink 2-3 cups of coffee a day. Occasional will bing drink for one day only. He did do this prior to most recent repeat cmp/k level. ? ?No renal disease on review.  ? ?Discussed patient's disability form and and would need to refer to neurology or neuropsychologist to fill out the form. ? ? ?  ?Observations/Objective: ?General-no acute distress, pleasant, oriented. ?Lungs- on inspection lungs appear unlabored. ?Neck- no tracheal deviation or jvd on inspection. ?Neuro- gross motor function appears intact.  ? ?Assessment and Plan: ? ?Patient Instructions  ?Hyperkalemia on recent labs.  On review no suspicious foods, medications or chronic medical conditions that would lead to high potassium.  Unusual that potassium did not go down while using Kayexalate.  Instead potassium increased.  Considering possible hemolysis though that was not noted on lab report. ? ?Recommend restarting 4-day prescription of Kayexalate.  Make sure will well-hydrated on day of repeat  future lab and cut down caffeine consumption as well as no heavy alcohol use prior to repeat labs. ? ?Asking to get scheduled for repeat metabolic panel/potassium level on 04/16/2022. ? ?Discussed disability form and need to refer to specialist to fill out the form. ? ? ? ?  ?Follow Up Instructions: ? ?  ?I discussed the assessment and treatment plan with the patient. The patient was provided an opportunity to ask questions and all were answered. The patient agreed with the plan and demonstrated an understanding of the instructions. ?  ?The patient was advised to call back or seek an in-person evaluation if the symptoms worsen or if the condition fails to improve as anticipated. ? ? ? ? ?Mackie Pai, PA-C  ? ?Review of Systems ? ?   ?Objective:  ? Physical Exam ? ? ? ? ?   ?Assessment & Plan:  ? ?Patient Instructions  ?Hyperkalemia on recent labs.  On review no suspicious foods, medications or chronic medical conditions that would lead to high potassium.  Unusual that potassium did not go down while using Kayexalate.  Instead potassium increased.  Considering possible hemolysis though that was not noted on lab report. ? ?Recommend restarting 4-day prescription of Kayexalate.  Make sure will well-hydrated on day of repeat future lab and cut down caffeine consumption as well as no heavy alcohol use prior to repeat labs. ? ?Asking to get scheduled for repeat metabolic panel/potassium level on 04/16/2022. ? ?  ? ?Time spent with patient today was 33  minutes which consisted of chart review,  discussing diagnosis, work up ,treatment, answering questions and documentation.  ?

## 2022-04-07 NOTE — Progress Notes (Signed)
Orders Placed This Encounter  ?Procedures  ? Ambulatory referral to Neuropsychology  ? ?Needs referral for cognitive evaluation. ? ? ?Penni Bombard, MD 2/80/0349, 1:79 PM ?Certified in Neurology, Neurophysiology and Neuroimaging ? ?[facility] ?[address], [address] ?[place], [place] [address] ?[phone] ? ?

## 2022-04-07 NOTE — Addendum Note (Signed)
Addended by: Anabel Halon on: 04/07/2022 05:37 PM ? ? Modules accepted: Orders ? ?

## 2022-04-07 NOTE — Telephone Encounter (Signed)
Appt scheduled

## 2022-04-08 ENCOUNTER — Other Ambulatory Visit (HOSPITAL_BASED_OUTPATIENT_CLINIC_OR_DEPARTMENT_OTHER): Payer: Self-pay

## 2022-04-08 NOTE — Patient Instructions (Addendum)
Hyperkalemia on recent labs.  On review no suspicious foods, medications or chronic medical conditions that would lead to high potassium.  Unusual that potassium did not go down while using Kayexalate.  Instead potassium increased.  Considering possible hemolysis though that was not noted on lab report. ? ?Recommend restarting 4-day prescription of Kayexalate.  Make sure will well-hydrated on day of repeat future lab and cut down caffeine consumption as well as no heavy alcohol use prior to repeat labs. ? ?Asking to get scheduled for repeat metabolic panel/potassium level on 04/16/2022. ? ?History of stroke and subsequent disability. Discussed disability form and need to refer to specialist to fill out the form. ? ? ? ? ?

## 2022-04-09 ENCOUNTER — Other Ambulatory Visit (HOSPITAL_BASED_OUTPATIENT_CLINIC_OR_DEPARTMENT_OTHER): Payer: Self-pay

## 2022-04-09 LAB — ALPHA-1 ANTITRYPSIN PHENOTYPE: A-1 Antitrypsin, Ser: 194 mg/dL (ref 83–199)

## 2022-04-12 ENCOUNTER — Encounter: Payer: Self-pay | Admitting: Gastroenterology

## 2022-04-12 ENCOUNTER — Ambulatory Visit (INDEPENDENT_AMBULATORY_CARE_PROVIDER_SITE_OTHER): Payer: Managed Care, Other (non HMO) | Admitting: Gastroenterology

## 2022-04-12 VITALS — BP 124/82 | HR 67 | Ht 74.0 in | Wt 253.0 lb

## 2022-04-12 DIAGNOSIS — Z860100 Personal history of colon polyps, unspecified: Secondary | ICD-10-CM

## 2022-04-12 DIAGNOSIS — K219 Gastro-esophageal reflux disease without esophagitis: Secondary | ICD-10-CM

## 2022-04-12 DIAGNOSIS — R9389 Abnormal findings on diagnostic imaging of other specified body structures: Secondary | ICD-10-CM

## 2022-04-12 MED ORDER — PANTOPRAZOLE SODIUM 40 MG PO TBEC: 40.0000 mg | DELAYED_RELEASE_TABLET | Freq: Every day | ORAL | 3 refills | Status: DC
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
## 2022-04-12 NOTE — Patient Instructions (Addendum)
If you are age 56 or older, your body mass index should be between 23-30. Your Body mass index is 32.48 kg/m?Marland Kitchen If this is out of the aforementioned range listed, please consider follow up with your Primary Care Provider. ? ?If you are age 5 or younger, your body mass index should be between 19-25. Your Body mass index is 32.48 kg/m?Marland Kitchen If this is out of the aformentioned range listed, please consider follow up with your Primary Care Provider.  ? ?________________________________________________________ ? ?The [facility] providers would like to encourage you to use [facility] to communicate with providers for non-urgent requests or questions.  Due to long hold times on the telephone, sending your provider a message by [facility] may be a faster and more efficient way to get a response.  Please allow 48 business hours for a response.  Please remember that this is for non-urgent requests.  ?_______________________________________________________ ? ?We have sent the following medications to your pharmacy for you to pick up at your convenience: ?Protonix ? ?It has been recommended to you by your physician that you have a(n) EGD/Colon completed. Per your request, we did not schedule the procedure(s) today. Please contact our office at [phone] in June to see if we have August or September. ? ?Please stop smoking ? ?Thank you, ? ?Dr. Jackquline Denmark ? ?

## 2022-04-13 ENCOUNTER — Telehealth: Payer: Self-pay | Admitting: Gastroenterology

## 2022-04-13 MED ORDER — PANTOPRAZOLE SODIUM 40 MG PO TBEC: 40.0000 mg | DELAYED_RELEASE_TABLET | Freq: Every day | ORAL | 3 refills | Status: DC

## 2022-04-13 NOTE — Telephone Encounter (Signed)
Done to new pharmacy ?

## 2022-04-13 NOTE — Telephone Encounter (Signed)
Received a call from patient's wife regarding the Protonix he was prescribed yesterday.  Due to insurance reasons, this prescription needs to be called into [facility], [address], [place], [place].  If there are any issues with this, please call patient and advise.  Thank you. ?

## 2022-04-16 ENCOUNTER — Other Ambulatory Visit (INDEPENDENT_AMBULATORY_CARE_PROVIDER_SITE_OTHER): Payer: Managed Care, Other (non HMO)

## 2022-04-16 DIAGNOSIS — E875 Hyperkalemia: Secondary | ICD-10-CM

## 2022-04-16 LAB — COMPREHENSIVE METABOLIC PANEL
ALT: 17 U/L (ref 0–53)
AST: 14 U/L (ref 0–37)
Albumin: 4.4 g/dL (ref 3.5–5.2)
Alkaline Phosphatase: 80 U/L (ref 39–117)
BUN: 10 mg/dL (ref 6–23)
CO2: 28 mEq/L (ref 19–32)
Calcium: 9.9 mg/dL (ref 8.4–10.5)
Chloride: 102 mEq/L (ref 96–112)
Creatinine, Ser: 0.7 mg/dL (ref 0.40–1.50)
GFR: 103.77 mL/min (ref >60.00)
Glucose, Bld: 96 mg/dL (ref 70–99)
Potassium: 5.2 mEq/L — ABNORMAL HIGH (ref 3.5–5.1)
Sodium: 138 mEq/L (ref 135–145)
Total Bilirubin: 0.5 mg/dL (ref 0.2–1.2)
Total Protein: 7 g/dL (ref 6.0–8.3)

## 2022-04-20 ENCOUNTER — Ambulatory Visit (INDEPENDENT_AMBULATORY_CARE_PROVIDER_SITE_OTHER): Payer: Managed Care, Other (non HMO) | Admitting: Cardiology

## 2022-04-20 ENCOUNTER — Encounter: Payer: Self-pay | Admitting: Cardiology

## 2022-04-20 VITALS — BP 130/80 | HR 71 | Ht 74.0 in | Wt 251.0 lb

## 2022-04-20 DIAGNOSIS — I2510 Atherosclerotic heart disease of native coronary artery without angina pectoris: Secondary | ICD-10-CM | POA: Diagnosis not present

## 2022-04-20 DIAGNOSIS — I6930 Unspecified sequelae of cerebral infarction: Secondary | ICD-10-CM

## 2022-04-20 DIAGNOSIS — F17200 Nicotine dependence, unspecified, uncomplicated: Secondary | ICD-10-CM

## 2022-04-20 DIAGNOSIS — J449 Chronic obstructive pulmonary disease, unspecified: Secondary | ICD-10-CM

## 2022-04-20 DIAGNOSIS — E782 Mixed hyperlipidemia: Secondary | ICD-10-CM

## 2022-04-20 DIAGNOSIS — I2584 Coronary atherosclerosis due to calcified coronary lesion: Secondary | ICD-10-CM

## 2022-04-20 MED ORDER — ASPIRIN 81 MG PO TBEC: 81.0000 mg | DELAYED_RELEASE_TABLET | Freq: Every day | ORAL | 3 refills | Status: AC
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
## 2022-04-20 NOTE — Patient Instructions (Signed)
Medication Instructions:  Your physician has recommended you make the following change in your medication:   START: Enteric Coated Aspirin '81mg'$  1 tablet by mouth daily    Lab Work: None Ordered If you have labs (blood work) drawn today and your tests are completely normal, you will receive your results only by: [place] (if you have MyChart) OR A paper copy in the mail If you have any lab test that is abnormal or we need to change your treatment, we will call you to review the results.   Testing/Procedures: None Ordered   Follow-Up: At [facility], you and your health needs are our priority.  As part of our continuing mission to provide you with exceptional heart care, we have created designated Provider Care Teams.  These Care Teams include your primary Cardiologist (physician) and Advanced Practice Providers (APPs -  Physician Assistants and Nurse Practitioners) who all work together to provide you with the care you need, when you need it.  We recommend signing up for the patient portal called "MyChart".  Sign up information is provided on this After Visit Summary.  MyChart is used to connect with patients for Virtual Visits (Telemedicine).  Patients are able to view lab/test results, encounter notes, upcoming appointments, etc.  Non-urgent messages can be sent to your provider as well.   To learn more about what you can do with MyChart, go to [email or web address].    Your next appointment:   5 month(s)  The format for your next appointment:   In Person  Provider:   Jenne Campus, MD    Other Instructions NA

## 2022-04-28 ENCOUNTER — Ambulatory Visit (INDEPENDENT_AMBULATORY_CARE_PROVIDER_SITE_OTHER): Payer: Managed Care, Other (non HMO) | Admitting: Medical

## 2022-04-28 ENCOUNTER — Encounter: Payer: Self-pay | Admitting: Medical

## 2022-04-28 ENCOUNTER — Ambulatory Visit (HOSPITAL_BASED_OUTPATIENT_CLINIC_OR_DEPARTMENT_OTHER)
Admission: RE | Admit: 2022-04-28 | Discharge: 2022-04-28 | Disposition: A | Discharge status: Home / Self Care | Payer: Managed Care, Other (non HMO) | Source: Ambulatory Visit | Attending: Medical | Admitting: Medical

## 2022-04-28 VITALS — BP 112/70 | HR 70 | Temp 98.4°F | Resp 18 | Ht 74.0 in | Wt 252.4 lb

## 2022-04-28 DIAGNOSIS — G8929 Other chronic pain: Secondary | ICD-10-CM | POA: Diagnosis present

## 2022-04-28 DIAGNOSIS — R918 Other nonspecific abnormal finding of lung field: Secondary | ICD-10-CM | POA: Diagnosis not present

## 2022-04-28 DIAGNOSIS — M5440 Lumbago with sciatica, unspecified side: Secondary | ICD-10-CM | POA: Insufficient documentation

## 2022-04-28 DIAGNOSIS — E875 Hyperkalemia: Secondary | ICD-10-CM

## 2022-04-28 DIAGNOSIS — R911 Solitary pulmonary nodule: Secondary | ICD-10-CM | POA: Diagnosis not present

## 2022-04-28 LAB — COMPREHENSIVE METABOLIC PANEL
ALT: 20 U/L (ref 0–53)
AST: 17 U/L (ref 0–37)
Albumin: 4.6 g/dL (ref 3.5–5.2)
Alkaline Phosphatase: 77 U/L (ref 39–117)
BUN: 10 mg/dL (ref 6–23)
CO2: 28 mEq/L (ref 19–32)
Calcium: 10 mg/dL (ref 8.4–10.5)
Chloride: 100 mEq/L (ref 96–112)
Creatinine, Ser: 0.74 mg/dL (ref 0.40–1.50)
GFR: 102.02 mL/min (ref >60.00)
Glucose, Bld: 92 mg/dL (ref 70–99)
Potassium: 5.1 mEq/L (ref 3.5–5.1)
Sodium: 137 mEq/L (ref 135–145)
Total Bilirubin: 0.6 mg/dL (ref 0.2–1.2)
Total Protein: 7.2 g/dL (ref 6.0–8.3)
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
## 2022-04-28 NOTE — Patient Instructions (Addendum)
High potassium in recent past. No cause found on review. Will get cmp today and see if your level is high again.   Since your bp is low don't think giving diuretic good idea to lower K level.  For severe side back pain will get lumbar back xray today. If you can scan the mri studies of lumbar spine will be helpful.  If your pain worsens let me know and consider use of norco. But will hold due to your reluctance.  Follow up date to be determined after lab review.

## 2022-05-26 ENCOUNTER — Ambulatory Visit: Payer: Managed Care, Other (non HMO) | Admitting: Emergency Medicine

## 2022-06-08 ENCOUNTER — Encounter: Payer: Self-pay | Admitting: Gastroenterology

## 2022-06-30 ENCOUNTER — Other Ambulatory Visit: Payer: Self-pay

## 2022-06-30 ENCOUNTER — Ambulatory Visit (AMBULATORY_SURGERY_CENTER): Payer: Self-pay | Admitting: *Deleted

## 2022-06-30 VITALS — Ht 74.0 in | Wt 250.0 lb

## 2022-06-30 DIAGNOSIS — Z86010 Personal history of colonic polyps: Secondary | ICD-10-CM

## 2022-06-30 DIAGNOSIS — K219 Gastro-esophageal reflux disease without esophagitis: Secondary | ICD-10-CM

## 2022-06-30 MED ORDER — NA SULFATE-K SULFATE-MG SULF 17.5-3.13-1.6 GM/177ML PO SOLN: 1.0000 | Freq: Once | ORAL | 0 refills | Status: AC

## 2022-06-30 NOTE — Progress Notes (Signed)
Pre visit completed in person.  No egg or soy allergy known to patient  No issues known to pt with past sedation with any surgeries or procedures Patient denies ever being told they had issues or difficulty with intubation  No FH of Malignant Hyperthermia Pt is not on diet pills Pt is not on  home 02  Pt is not on blood thinners  Pt denies issues with constipation  No A fib or A flutter  Pt instructed to use [email or web address] or GoodRx for a price reduction on prep   

## 2022-07-06 ENCOUNTER — Encounter: Payer: Self-pay | Admitting: Diagnostic Neuroimaging

## 2022-07-06 ENCOUNTER — Ambulatory Visit (INDEPENDENT_AMBULATORY_CARE_PROVIDER_SITE_OTHER): Payer: Managed Care, Other (non HMO) | Admitting: Diagnostic Neuroimaging

## 2022-07-06 VITALS — BP 128/83 | HR 74 | Ht 74.0 in | Wt 254.2 lb

## 2022-07-06 DIAGNOSIS — G3185 Corticobasal degeneration: Secondary | ICD-10-CM

## 2022-07-06 DIAGNOSIS — G43109 Migraine with aura, not intractable, without status migrainosus: Secondary | ICD-10-CM | POA: Diagnosis not present

## 2022-07-06 DIAGNOSIS — R259 Unspecified abnormal involuntary movements: Secondary | ICD-10-CM | POA: Diagnosis not present

## 2022-07-06 DIAGNOSIS — R4189 Other symptoms and signs involving cognitive functions and awareness: Secondary | ICD-10-CM

## 2022-07-06 DIAGNOSIS — R269 Unspecified abnormalities of gait and mobility: Secondary | ICD-10-CM

## 2022-07-06 MED ORDER — SERTRALINE HCL 25 MG PO TABS: 25.0000 mg | ORAL_TABLET | Freq: Every day | ORAL | 12 refills | Status: DC
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
## 2022-07-07 ENCOUNTER — Telehealth: Payer: Self-pay | Admitting: *Deleted

## 2022-07-07 NOTE — Telephone Encounter (Signed)
Received [employer]. Placed on MD desk for review, completion, signature.

## 2022-07-07 NOTE — Telephone Encounter (Signed)
Called wife and advised her the [place] [employer] paperwork is ready, but I need phone/fax #s. She provided #. I advised will fax today. She requested a copy be e mailed to her.  Physician statement, last 2 office notes, MRI brain report faxed to [place] [employer]. Received confirmation. E mailed copy of Physician statement to wife. Sent Physician statement to medical records to be scanned

## 2022-07-14 ENCOUNTER — Ambulatory Visit (HOSPITAL_BASED_OUTPATIENT_CLINIC_OR_DEPARTMENT_OTHER): Payer: Managed Care, Other (non HMO)

## 2022-07-19 NOTE — Addendum Note (Signed)
Addended by: Anabel Halon on: 07/19/2022 08:32 AM   Modules accepted: Orders

## 2022-07-28 ENCOUNTER — Ambulatory Visit
Admission: RE | Admit: 2022-07-28 | Discharge: 2022-07-28 | Disposition: A | Discharge status: Home / Self Care | Payer: Managed Care, Other (non HMO) | Source: Ambulatory Visit | Attending: Emergency Medicine | Admitting: Emergency Medicine

## 2022-07-28 DIAGNOSIS — Z0271 Encounter for disability determination: Secondary | ICD-10-CM

## 2022-07-28 DIAGNOSIS — R911 Solitary pulmonary nodule: Secondary | ICD-10-CM

## 2022-07-29 ENCOUNTER — Encounter: Payer: Self-pay | Admitting: Emergency Medicine

## 2022-07-29 ENCOUNTER — Ambulatory Visit (INDEPENDENT_AMBULATORY_CARE_PROVIDER_SITE_OTHER): Payer: Managed Care, Other (non HMO) | Admitting: Emergency Medicine

## 2022-07-29 ENCOUNTER — Ambulatory Visit: Payer: Managed Care, Other (non HMO) | Admitting: Emergency Medicine

## 2022-07-29 DIAGNOSIS — J449 Chronic obstructive pulmonary disease, unspecified: Secondary | ICD-10-CM

## 2022-07-29 DIAGNOSIS — F17200 Nicotine dependence, unspecified, uncomplicated: Secondary | ICD-10-CM | POA: Diagnosis not present

## 2022-07-29 DIAGNOSIS — R918 Other nonspecific abnormal finding of lung field: Secondary | ICD-10-CM

## 2022-07-29 DIAGNOSIS — R911 Solitary pulmonary nodule: Secondary | ICD-10-CM

## 2022-07-29 LAB — PULMONARY FUNCTION TEST
DL/VA % pred: 64 %
DL/VA: 2.75 ml/min/mmHg/L
DLCO cor % pred: 60 %
DLCO cor: 18.94 ml/min/mmHg
DLCO unc % pred: 60 %
DLCO unc: 18.99 ml/min/mmHg
FEF 25-75 Post: 2.58 L/sec
FEF 25-75 Pre: 1.28 L/sec
FEF2575-%Change-Post: 101 %
FEF2575-%Pred-Post: 73 %
FEF2575-%Pred-Pre: 36 %
FEV1-%Change-Post: 24 %
FEV1-%Pred-Post: 72 %
FEV1-%Pred-Pre: 57 %
FEV1-Post: 3.01 L
FEV1-Pre: 2.41 L
FEV1FVC-%Change-Post: 5 %
FEV1FVC-%Pred-Pre: 81 %
FEV6-%Change-Post: 19 %
FEV6-%Pred-Post: 85 %
FEV6-%Pred-Pre: 71 %
FEV6-Post: 4.48 L
FEV6-Pre: 3.75 L
FEV6FVC-%Change-Post: 0 %
FEV6FVC-%Pred-Post: 102 %
FEV6FVC-%Pred-Pre: 101 %
FVC-%Change-Post: 18 %
FVC-%Pred-Post: 83 %
FVC-%Pred-Pre: 70 %
FVC-Post: 4.56 L
FVC-Pre: 3.85 L
Post FEV1/FVC ratio: 66 %
Post FEV6/FVC ratio: 98 %
Pre FEV1/FVC ratio: 63 %
Pre FEV6/FVC Ratio: 97 %
RV % pred: 209 %
RV: 4.84 L
TLC % pred: 116 %
TLC: 8.86 L

## 2022-07-29 MED ORDER — ALBUTEROL SULFATE HFA 108 (90 BASE) MCG/ACT IN AERS: 2.0000 | INHALATION_SPRAY | Freq: Four times a day (QID) | RESPIRATORY_TRACT | 6 refills | Status: AC | PRN

## 2022-07-29 NOTE — Assessment & Plan Note (Signed)
Severe obstruction with a positive bronchodilator response confirmed on his pulmonary function testing today.  That said he does not have many day-to-day symptoms.  He wants to avoid maintenance BD at this time.  We will try albuterol to see if he gets benefit.  Depending on his response may decide to start maintenance therapy next time.

## 2022-07-29 NOTE — Progress Notes (Signed)
Subjective:    Patient ID: Lee Andrews, male    DOB: 1966/05/20, 56 y.o.   MRN: [id number]  HPI 56 year old man with a history of tobacco use (50 pack years) 1.5-2 pk/day, seasonal allergies, childhood asthma, migraines, history of CVA.  He is referred today for evaluation of emphysema, pulmonary nodular disease noted on lung cancer screening CT chest done 01/05/2022.   Moved from [place] in the last year. He has been experiencing more nasal congestion, drainage. Lots more cough and throat clearing - clear mucous. Has not needed any BD since he was a child. He gets SOB with exertion, inclines, stairs. He can intermittently hear wheeze. Gets bronchitis frequently.    Low-dose CT chest 01/05/2022 reviewed by me showed centrilobular and paraseptal emphysema with changes consistent with bronchiolitis, pulmonary nodules that measure up to 7.4 mm in the inferior lateral right lower lobe.  This was read as a lung RADS 3 study with plans to repeat in 6 months (August 2023)   ROV 07/29/2022 --follow-up visit 56 year old gentleman with history of tobacco use.  He had childhood asthma, has a history of seasonal allergies, migraines prior CVA.  I saw him in May for evaluation of emphysema and pulmonary nodular disease that was seen on his lung cancer screening CT (12/2021).  Question possible coccidiomycosis as he is from [place] although no known history. He can get some exertional SOB with stairs, not very active. Has a cough in the am. Some loss of his stamina. Some wheeze in the evenings.   Pulmonary function testing performed today and reviewed by me show severe obstruction with a vigorous bronchodilator response.  Lung volumes are hyperinflated.  Diffusion capacity is decreased and does not fully correct when adjusted for his alveolar volume.  CT chest 07/28/2022 reviewed by me shows no significant change in the size or appearance of his 7.5 mm inferior lateral right lower lobe pulmonary nodule.  Final read is  pending    Review of Systems As per HPI   Past Medical History:  Diagnosis Date   Allergy    SEASONAL   Asthma    "younger grew out of it"   Depression    "SOMETIMES"   Emphysema of lung ([place])    Hyperlipidemia    Memory deficit    Migraine    Stroke ([place]) 2019     Family History  Problem Relation Age of Onset   Heart disease Mother    Colon cancer Neg Hx    Colon polyps Neg Hx    Esophageal cancer Neg Hx    Rectal cancer Neg Hx    Stomach cancer Neg Hx      Social History   Socioeconomic History   Marital status: Married    Spouse name: [name]   Number of children: Not on file   Years of education: Not on file   Highest education level: Some college, no degree  Occupational History   Not on file  Tobacco Use   Smoking status: Every Day    Packs/day: 1.50    Years: 35.00    Total pack years: 52.50    Types: Cigarettes    Passive exposure: Never   Smokeless tobacco: Never   Tobacco comments:    1 pack smoked daily ARJ 07/29/22  Vaping Use   Vaping Use: Never used  Substance and Sexual Activity   Alcohol use: Yes    Comment: socially   Drug use: Never   Sexual activity: Yes  Other Topics  Concern   Not on file  Social History Narrative   Lives with wife   Caffeine- coffee, 2L diet pepsi   Social Determinants of Health   Financial Resource Strain: Not on file  Food Insecurity: Not on file  Transportation Needs: Not on file  Physical Activity: Not on file  Stress: Not on file  Social Connections: Not on file  Intimate Partner Violence: Not on file    From [place].  Has worked Architect.  Had a swamp cooler growing up, then [facility]   No Known Allergies   Outpatient Medications Prior to Visit  Medication Sig Dispense Refill   aspirin 325 MG tablet Take 325 mg by mouth daily.     aspirin EC 81 MG tablet Take 1 tablet (81 mg total) by mouth daily. Swallow whole. 90 tablet 3   pantoprazole (PROTONIX) 40 MG tablet Take 1 tablet (40 mg total) by  mouth daily. 90 tablet 3   rosuvastatin (CRESTOR) 20 MG tablet Take 1 tablet (20 mg total) by mouth daily. 90 tablet 3   sertraline (ZOLOFT) 25 MG tablet Take 1 tablet (25 mg total) by mouth daily. 30 tablet 12   sildenafil (VIAGRA) 100 MG tablet Take 0.5-1 tablets (50-100 mg total) by mouth daily as needed for erectile dysfunction. 10 tablet 3   Ubrogepant (UBRELVY) 50 MG TABS Take 50 mg by mouth as needed. May repeat x 1 tab after 2 hours; max 2 tabs per day or 8 per month 8 tablet 6   Fremanezumab-vfrm (AJOVY) 225 MG/1.5ML SOAJ Inject 225 mg into the skin every 30 (thirty) days. 1.68 mL 6   No facility-administered medications prior to visit.         Objective:   Physical Exam Vitals:   07/29/22 0950  BP: 136/74  Pulse: 78  Temp: 97.9 F (36.6 C)  TempSrc: Oral  SpO2: 96%  Weight: 253 lb (114.8 kg)  Height: '6\' 1"'$  (1.854 m)   Gen: Pleasant, well-nourished, in no distress,  normal affect  ENT: No lesions,  mouth clear,  oropharynx clear, no postnasal drip  Neck: No JVD, no stridor  Lungs: No use of accessory muscles, no crackles or wheezing on normal respiration, no wheeze on forced expiration  Cardiovascular: RRR, heart sounds normal, no murmur or gallops, no peripheral edema  Musculoskeletal: No deformities, no cyanosis or clubbing  Neuro: alert, awake, non focal  Skin: Warm, no lesions or rash      Assessment & Plan:  Pulmonary nodules We reviewed your CT scan of the chest today.  Your small right lower lobe pulmonary nodule is stable in size and appearance.  This is good news.  We will plan to repeat your CT chest in 6-12 months, depending on the recommendations from the official radiology report when it is available. Follow Dr. Lamonte Sakai in 6 months, sooner if you have any problems.  COPD (chronic obstructive pulmonary disease) (HCC) Severe obstruction with a positive bronchodilator response confirmed on his pulmonary function testing today.  That said he does not  have many day-to-day symptoms.  He wants to avoid maintenance BD at this time.  We will try albuterol to see if he gets benefit.  Depending on his response may decide to start maintenance therapy next time.  Smoking Discussed cessation today.  He is smoking 1 pack daily.  Is not really working on decreasing.  I did encourage him to think about it.   Baltazar Apo, MD, PhD 07/29/2022, 10:17 AM [facility] [phone] or if no answer before 7:00PM call [phone] For any issues after 7:00PM please call eLink [phone]

## 2022-07-29 NOTE — Patient Instructions (Signed)
We reviewed your CT scan of the chest today.  Your small right lower lobe pulmonary nodule is stable in size and appearance.  This is good news.  We will plan to repeat your CT chest in 6-12 months, depending on the recommendations from the official radiology report when it is available. We reviewed your pulmonary function testing today.  This showed evidence for abnormal airflow, COPD. Try using albuterol 2 puffs if needed for shortness of breath, chest tightness, wheezing.  You might also want to try taking 2 puffs before activity to see if it helps her breathing.  Depending on how you do on this medication we may decide to start an every day scheduled inhaler at some point going forward. Continue to work on decreasing your cigarettes.  Ultimate goal will be to stop altogether.  We will try to help you with this. Follow Dr. Lamonte Sakai in 6 months, sooner if you have any problems.

## 2022-07-29 NOTE — Assessment & Plan Note (Signed)
We reviewed your CT scan of the chest today.  Your small right lower lobe pulmonary nodule is stable in size and appearance.  This is good news.  We will plan to repeat your CT chest in 6-12 months, depending on the recommendations from the official radiology report when it is available. Follow Dr. Lamonte Sakai in 6 months, sooner if you have any problems.

## 2022-07-29 NOTE — Progress Notes (Signed)
PFT done today. 

## 2022-07-29 NOTE — Addendum Note (Signed)
Addended by: Gavin Potters R on: 07/29/2022 10:28 AM   Modules accepted: Orders

## 2022-07-29 NOTE — Assessment & Plan Note (Signed)
Discussed cessation today.  He is smoking 1 pack daily.  Is not really working on decreasing.  I did encourage him to think about it.

## 2022-07-30 ENCOUNTER — Other Ambulatory Visit: Payer: Self-pay | Admitting: *Deleted

## 2022-07-30 ENCOUNTER — Encounter: Payer: Self-pay | Admitting: Gastroenterology

## 2022-07-30 ENCOUNTER — Ambulatory Visit (AMBULATORY_SURGERY_CENTER): Payer: Managed Care, Other (non HMO) | Admitting: Gastroenterology

## 2022-07-30 VITALS — BP 118/72 | HR 62 | Temp 99.3°F | Resp 13 | Ht 74.0 in | Wt 250.0 lb

## 2022-07-30 DIAGNOSIS — Z86010 Personal history of colonic polyps: Secondary | ICD-10-CM

## 2022-07-30 DIAGNOSIS — K31A0 Gastric intestinal metaplasia, unspecified: Secondary | ICD-10-CM | POA: Diagnosis not present

## 2022-07-30 DIAGNOSIS — K2100 Gastro-esophageal reflux disease with esophagitis, without bleeding: Secondary | ICD-10-CM

## 2022-07-30 DIAGNOSIS — K2950 Unspecified chronic gastritis without bleeding: Secondary | ICD-10-CM

## 2022-07-30 DIAGNOSIS — K635 Polyp of colon: Secondary | ICD-10-CM | POA: Diagnosis not present

## 2022-07-30 DIAGNOSIS — B9681 Helicobacter pylori [H. pylori] as the cause of diseases classified elsewhere: Secondary | ICD-10-CM

## 2022-07-30 DIAGNOSIS — K219 Gastro-esophageal reflux disease without esophagitis: Secondary | ICD-10-CM

## 2022-07-30 DIAGNOSIS — D124 Benign neoplasm of descending colon: Secondary | ICD-10-CM

## 2022-07-30 DIAGNOSIS — Z09 Encounter for follow-up examination after completed treatment for conditions other than malignant neoplasm: Secondary | ICD-10-CM

## 2022-07-30 DIAGNOSIS — D125 Benign neoplasm of sigmoid colon: Secondary | ICD-10-CM

## 2022-07-30 MED ORDER — PANTOPRAZOLE SODIUM 40 MG PO TBEC: 40.0000 mg | DELAYED_RELEASE_TABLET | Freq: Two times a day (BID) | ORAL | 1 refills | Status: DC

## 2022-07-30 MED ORDER — SODIUM CHLORIDE 0.9 % IV SOLN: 500.0000 mL | Freq: Once | INTRAVENOUS | Status: DC

## 2022-07-30 NOTE — Op Note (Signed)
[facility] Patient Name: Lee Andrews Procedure Date: 07/30/2022 10:07 AM MRN: [id number] Endoscopist: Jackquline Denmark , MD Age: 56 Referring MD:  Date of Birth: 1966-07-07 Gender: Male Account #: [id number] Procedure:                Upper GI endoscopy Indications:              GERD with abn CT chest showing distal esophageal                            thickening. Medicines:                Monitored Anesthesia Care Procedure:                Pre-Anesthesia Assessment:                           - Prior to the procedure, a History and Physical                            was performed, and patient medications and                            allergies were reviewed. The patient's tolerance of                            previous anesthesia was also reviewed. The risks                            and benefits of the procedure and the sedation                            options and risks were discussed with the patient.                            All questions were answered, and informed consent                            was obtained. Prior Anticoagulants: The patient has                            taken no previous anticoagulant or antiplatelet                            agents. ASA Grade Assessment: II - A patient with                            mild systemic disease. After reviewing the risks                            and benefits, the patient was deemed in                            satisfactory condition to undergo the procedure.  After obtaining informed consent, the endoscope was                            passed under direct vision. Throughout the                            procedure, the patient's blood pressure, pulse, and                            oxygen saturations were monitored continuously. The                            Endoscope was introduced through the mouth, and                            advanced to the second part of duodenum. The upper                             GI endoscopy was accomplished without difficulty.                            The patient tolerated the procedure well. Scope In: Scope Out: Findings:                 There were esophageal mucosal changes suspicious                            for short-segment Barrett's esophagus present in                            the lower third of the esophagus extending from 35                            cm up to 38 cm. Mucosa was biopsied with a cold                            forceps for histology. A wide open distal                            esophageal stricture was noted at GE junction 38 cm                            from the incisors. There was distal esophageal                            scarring and a small 6 mm ulcer at the most                            proximal margin of Barrett's. A small diverticulum                            was noted. Few biopsies were obtained and sent in a  separate jar                           A 2 cm hiatal hernia was present.                           Localized mild inflammation characterized by                            erythema was found in the gastric antrum. Biopsies                            were taken with a cold forceps for histology.                           The examined duodenum was normal. Complications:            No immediate complications. Estimated Blood Loss:     Estimated blood loss: none. Impression:               - Esophageal mucosal changes suspicious for                            short-segment Barrett's esophagus. Biopsied.                           - Wide open distal esophageal stricture with distal                            esophageal scarring.                           - 2 cm hiatal hernia.                           - Gastritis. Biopsied. Recommendation:           - Patient has a contact number available for                            emergencies. The signs and symptoms of  potential                            delayed complications were discussed with the                            patient. Return to normal activities tomorrow.                            Written discharge instructions were provided to the                            patient.                           - Resume previous diet.                           -  Protonix 40 mg p.o. twice daily x 8 weeks, then                            decrease it to once a day.                           - Stop smoking.                           - Await pathology results.                           - Brochures regarding reflux.                           - Repeat EGD in 3 years.                           - The findings and recommendations were discussed                            with the patient's family. Jackquline Denmark, MD 07/30/2022 10:43:27 AM This report has been signed electronically.

## 2022-07-30 NOTE — Progress Notes (Signed)
Pt's states no medical or surgical changes since previsit or office visit. 

## 2022-07-30 NOTE — Progress Notes (Signed)
Sedate, gd SR, tolerated procedure well, VSS, report to RN 

## 2022-07-30 NOTE — Op Note (Signed)
[facility] Patient Name: Lee Andrews Procedure Date: 07/30/2022 10:06 AM MRN: [id number] Endoscopist: Jackquline Denmark , MD Age: 56 Referring MD:  Date of Birth: [birth year] Gender: Male Account #: [id number] Procedure:                Colonoscopy Indications:              High risk colon cancer surveillance: Personal                            history of advanced colonic polyps 01/2022 s/p EMR Medicines:                Monitored Anesthesia Care Procedure:                Pre-Anesthesia Assessment:                           - Prior to the procedure, a History and Physical                            was performed, and patient medications and                            allergies were reviewed. The patient's tolerance of                            previous anesthesia was also reviewed. The risks                            and benefits of the procedure and the sedation                            options and risks were discussed with the patient.                            All questions were answered, and informed consent                            was obtained. Prior Anticoagulants: The patient has                            taken no previous anticoagulant or antiplatelet                            agents. ASA Grade Assessment: II - A patient with                            mild systemic disease. After reviewing the risks                            and benefits, the patient was deemed in                            satisfactory condition to undergo the procedure.  After obtaining informed consent, the colonoscope                            was passed under direct vision. Throughout the                            procedure, the patient's blood pressure, pulse, and                            oxygen saturations were monitored continuously. The                            CF [id number] #[id number] was introduced through the anus                            and advanced to the  the cecum, identified by                            appendiceal orifice and ileocecal valve. The                            colonoscopy was performed without difficulty. The                            patient tolerated the procedure well. The quality                            of the bowel preparation was good. Scope In: 10:22:21 AM Scope Out: 10:39:25 AM Scope Withdrawal Time: 0 hours 14 minutes 12 seconds  Total Procedure Duration: 0 hours 17 minutes 4 seconds  Findings:                 Four sessile polyps were found in the mid sigmoid                            colon and descending colon. The polyps were 4 to 6                            mm in size. These polyps were removed with a cold                            snare. Resection and retrieval were complete.                           A few small-mouthed diverticula were found in the                            sigmoid colon.                           Non-bleeding internal hemorrhoids were found during                            retroflexion. The hemorrhoids were small and  Grade                            I (internal hemorrhoids that do not prolapse).                           The exam was otherwise without abnormality on                            direct and retroflexion views. Complications:            No immediate complications. Estimated Blood Loss:     Estimated blood loss: none. Impression:               - Four 4 to 6 mm polyps in the mid sigmoid colon                            and in the descending colon, removed with a cold                            snare. Resected and retrieved.                           - Mild sigmoid diverticulosis.                           - Non-bleeding internal hemorrhoids.                           - The examination was otherwise normal on direct                            and retroflexion views. Recommendation:           - Patient has a contact number available for                             emergencies. The signs and symptoms of potential                            delayed complications were discussed with the                            patient. Return to normal activities tomorrow.                            Written discharge instructions were provided to the                            patient.                           - Resume previous diet.                           - Continue present medications.                           -  Await pathology results.                           - Repeat colonoscopy in 3 years for surveillance.                            EGD can be performed at the same time.                           - The findings and recommendations were discussed                            with the patient's family. Jackquline Denmark, MD 07/30/2022 10:46:43 AM This report has been signed electronically.

## 2022-07-30 NOTE — Patient Instructions (Addendum)
Handouts Provided:  Polyps  YOU HAD AN ENDOSCOPIC PROCEDURE TODAY AT [facility]:   Refer to the procedure report that was given to you for any specific questions about what was found during the examination.  If the procedure report does not answer your questions, please call your gastroenterologist to clarify.  If you requested that your care partner not be given the details of your procedure findings, then the procedure report has been included in a sealed envelope for you to review at your convenience later.  YOU SHOULD EXPECT: Some feelings of bloating in the abdomen. Passage of more gas than usual.  Walking can help get rid of the air that was put into your GI tract during the procedure and reduce the bloating. If you had a lower endoscopy (such as a colonoscopy or flexible sigmoidoscopy) you may notice spotting of blood in your stool or on the toilet paper. If you underwent a bowel prep for your procedure, you may not have a normal bowel movement for a few days.  Please Note:  You might notice some irritation and congestion in your nose or some drainage.  This is from the oxygen used during your procedure.  There is no need for concern and it should clear up in a day or so.  SYMPTOMS TO REPORT IMMEDIATELY:  Following lower endoscopy (colonoscopy or flexible sigmoidoscopy):  Excessive amounts of blood in the stool  Significant tenderness or worsening of abdominal pains  Swelling of the abdomen that is new, acute  Fever of 100F or higher  Following upper endoscopy (EGD)  Vomiting of blood or coffee ground material  New chest pain or pain under the shoulder blades  Painful or persistently difficult swallowing  New shortness of breath  Fever of 100F or higher  Black, tarry-looking stools  For urgent or emergent issues, a gastroenterologist can be reached at any hour by calling [phone]. Do not use MyChart messaging for urgent concerns.    DIET:  We do recommend  a small meal at first, but then you may proceed to your regular diet.  Drink plenty of fluids but you should avoid alcoholic beverages for 24 hours.  ACTIVITY:  You should plan to take it easy for the rest of today and you should NOT DRIVE or use heavy machinery until tomorrow (because of the sedation medicines used during the test).    FOLLOW UP: Our staff will call the number listed on your records the next business day following your procedure.  We will call around 7:15- 8:00 am to check on you and address any questions or concerns that you may have regarding the information given to you following your procedure. If we do not reach you, we will leave a message.  If you develop any symptoms (ie: fever, flu-like symptoms, shortness of breath, cough etc.) before then, please call [phone].  If you test positive for Covid 19 in the 2 weeks post procedure, please call and report this information to [place].    If any biopsies were taken you will be contacted by phone or by letter within the next 1-3 weeks.  Please call us at [phone] if you have not heard about the biopsies in 3 weeks.    SIGNATURES/CONFIDENTIALITY: You and/or your care partner have signed paperwork which will be entered into your electronic medical record.  These signatures attest to the fact that that the information above on your After Visit Summary has been reviewed and is understood.  Full responsibility of the confidentiality of this discharge information lies with you and/or your care-partner.
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
## 2022-08-03 ENCOUNTER — Telehealth: Payer: Self-pay

## 2022-08-03 NOTE — Telephone Encounter (Signed)
Left message on answering machine. 

## 2022-08-25 ENCOUNTER — Encounter: Payer: Self-pay | Admitting: Gastroenterology

## 2022-08-25 NOTE — Progress Notes (Signed)
Lee Andrews, can you let the pt know that gastric bx are + for H pylori. Would recommend the following treatment regimen for 14 days: Amoxicillin 1gm BID Clarithromycin '500mg'$  BID Flagyl '500mg'$  BID Protonix BID,then continue QD  Can you please order this if no signfiicant interactions noted. Once done with therapy, the patient should wait one month and perform a stool study for H pylori antigen to ensure negative. The PPI needs to be held at least 2 weeks prior to submitting the stool sample. The patient should avoid alcohol while taking Flagyl.   Thanks  Dr Lyndel Safe

## 2022-08-26 ENCOUNTER — Other Ambulatory Visit: Payer: Self-pay

## 2022-08-26 DIAGNOSIS — B9681 Helicobacter pylori [H. pylori] as the cause of diseases classified elsewhere: Secondary | ICD-10-CM

## 2022-08-26 MED ORDER — PANTOPRAZOLE SODIUM 40 MG PO TBEC: 40.0000 mg | DELAYED_RELEASE_TABLET | Freq: Two times a day (BID) | ORAL | 0 refills | Status: DC

## 2022-08-26 MED ORDER — CLARITHROMYCIN 500 MG PO TABS: 500.0000 mg | ORAL_TABLET | Freq: Two times a day (BID) | ORAL | 0 refills | Status: AC

## 2022-08-26 MED ORDER — AMOXICILLIN 500 MG PO CAPS: 1000.0000 mg | ORAL_CAPSULE | Freq: Two times a day (BID) | ORAL | 0 refills | Status: DC

## 2022-08-26 MED ORDER — METRONIDAZOLE 500 MG PO TABS: 500.0000 mg | ORAL_TABLET | Freq: Two times a day (BID) | ORAL | 0 refills | Status: AC

## 2022-09-01 ENCOUNTER — Telehealth: Payer: Self-pay | Admitting: Gastroenterology

## 2022-09-01 NOTE — Telephone Encounter (Signed)
Patients wife called regarding the H pylori diag and need for treatment.

## 2022-09-01 NOTE — Telephone Encounter (Addendum)
Pt wife [name] requested clarification on H Pylori and the treatment regiment: [name]  verbalized understanding with all questions answered.

## 2022-12-14 ENCOUNTER — Other Ambulatory Visit: Payer: Self-pay | Admitting: Medical

## 2023-01-07 ENCOUNTER — Telehealth: Payer: Self-pay | Admitting: Pharmacy Technician

## 2023-01-07 NOTE — Telephone Encounter (Signed)
Patient Advocate Encounter  Prior Authorization for Ubrelvy 50MG tablets has been approved.    PA# [id number] Key: [id number] Effective dates: 01/07/2023 through 01/07/2024      Lyndel Safe, [place] Patient Advocate Specialist [facility] [facility] Patient Advocate Team Direct Number: [phone]  Fax: [phone]

## 2023-01-18 ENCOUNTER — Encounter: Payer: Self-pay | Admitting: Diagnostic Neuroimaging

## 2023-01-18 ENCOUNTER — Ambulatory Visit (INDEPENDENT_AMBULATORY_CARE_PROVIDER_SITE_OTHER): Payer: Managed Care, Other (non HMO) | Admitting: Diagnostic Neuroimaging

## 2023-01-18 VITALS — BP 134/84 | HR 78 | Ht 74.0 in | Wt 262.0 lb

## 2023-01-18 DIAGNOSIS — R259 Unspecified abnormal involuntary movements: Secondary | ICD-10-CM

## 2023-01-18 DIAGNOSIS — R4189 Other symptoms and signs involving cognitive functions and awareness: Secondary | ICD-10-CM

## 2023-01-18 DIAGNOSIS — R269 Unspecified abnormalities of gait and mobility: Secondary | ICD-10-CM

## 2023-01-18 DIAGNOSIS — G3185 Corticobasal degeneration: Secondary | ICD-10-CM

## 2023-01-18 DIAGNOSIS — G43109 Migraine with aura, not intractable, without status migrainosus: Secondary | ICD-10-CM | POA: Diagnosis not present

## 2023-01-18 DIAGNOSIS — I6389 Other cerebral infarction: Secondary | ICD-10-CM

## 2023-01-18 MED ORDER — UBRELVY 50 MG PO TABS: 50.0000 mg | ORAL_TABLET | ORAL | 6 refills | Status: DC | PRN

## 2023-01-18 MED ORDER — AJOVY 225 MG/1.5ML ~~LOC~~ SOAJ: 225.0000 mg | SUBCUTANEOUS | 4 refills | Status: DC

## 2023-01-18 MED ORDER — ONDANSETRON 4 MG PO TBDP: 4.0000 mg | ORAL_TABLET | Freq: Three times a day (TID) | ORAL | 0 refills | Status: DC | PRN

## 2023-01-18 NOTE — Patient Instructions (Signed)
HEADACHES (migraine with aura) - consider ajovy monthly injections - consider ubrelvy as needed (not tried yet); avoid triptans - consider zofran as needed for nausea  [email or web address]  [email or web address]
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
## 2023-01-18 NOTE — Addendum Note (Signed)
Addended by: Penni Bombard on: 01/18/2023 04:44 PM   Modules accepted: Orders

## 2023-01-21 IMAGING — CR DG LUMBAR SPINE 2-3V
3 series · 3 of 3 positions shown · non-contrast
Comparison: None Available.

CLINICAL DATA: Low back pain with sciatica bilaterally.  No injury.

EXAM:
LUMBAR SPINE - 2-3 VIEW

[t l-spine a.p.]
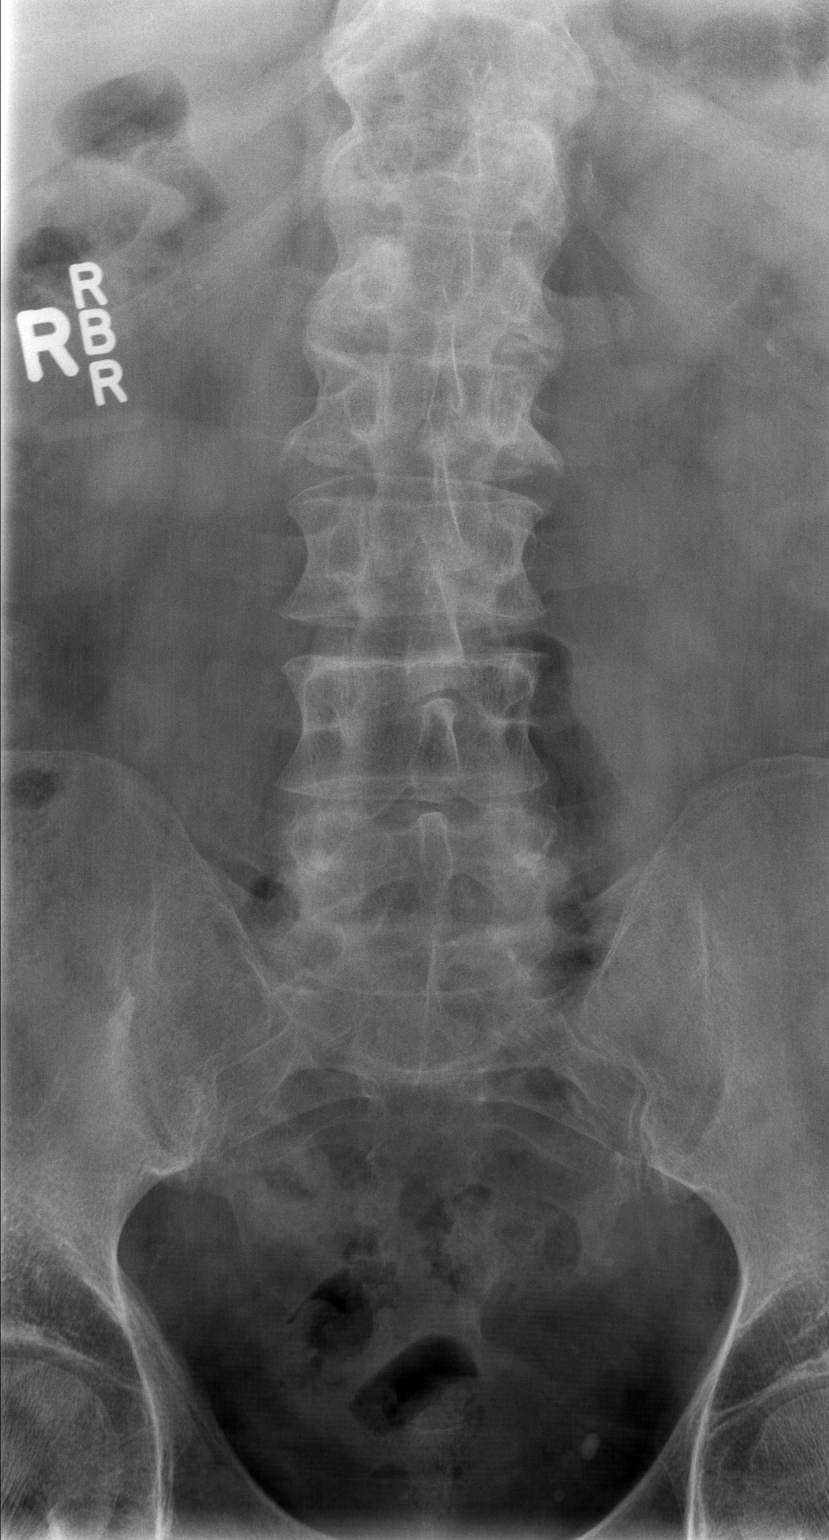

[t l-spine lat]
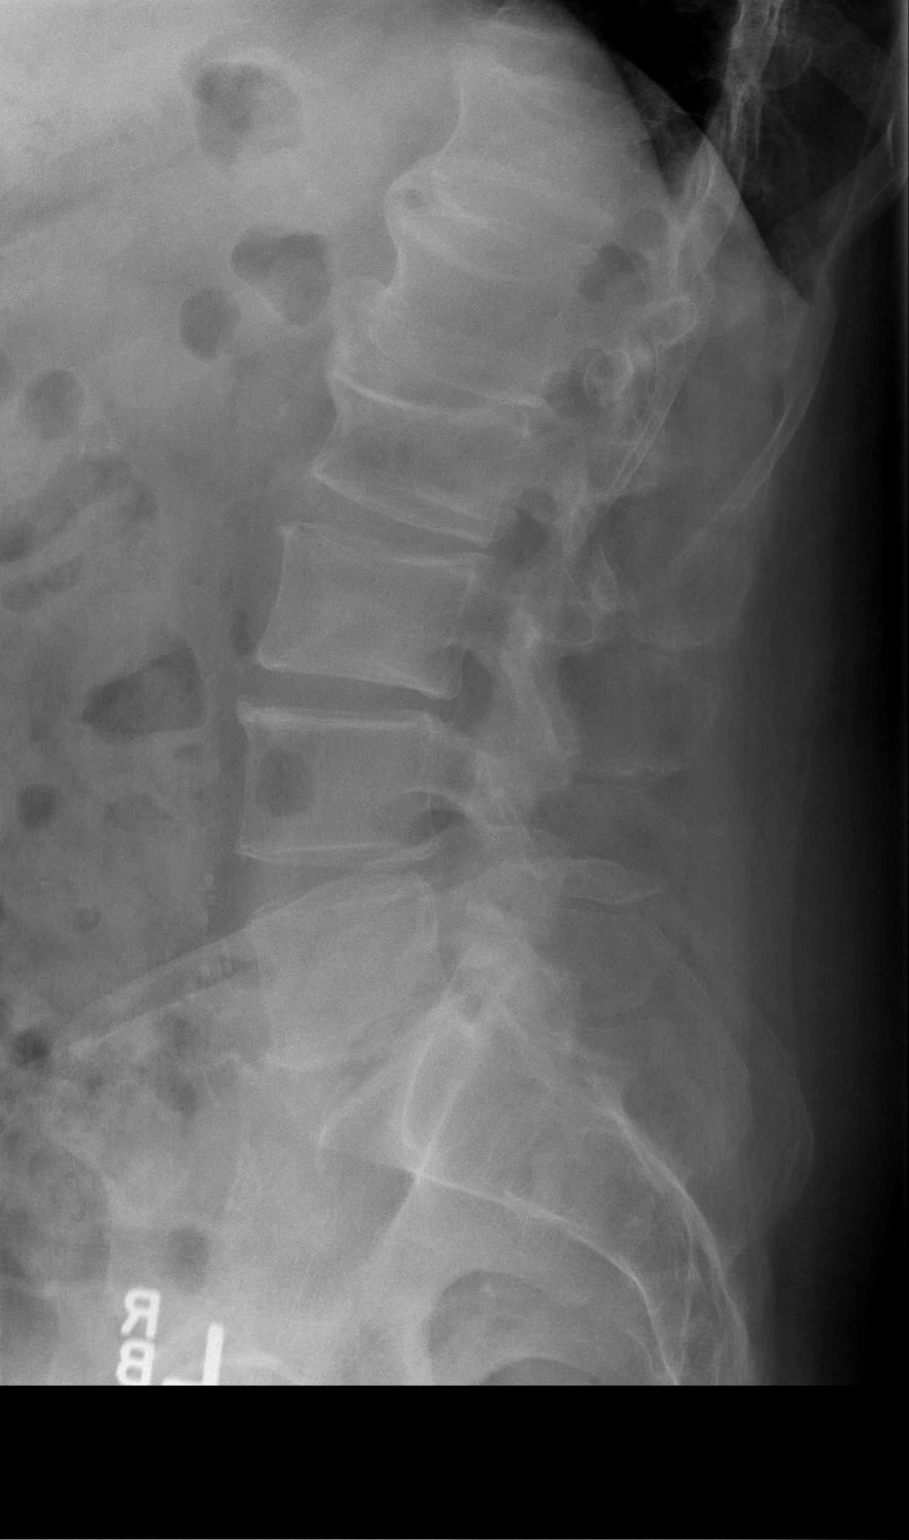

[t l-spine l5-s1 spot]
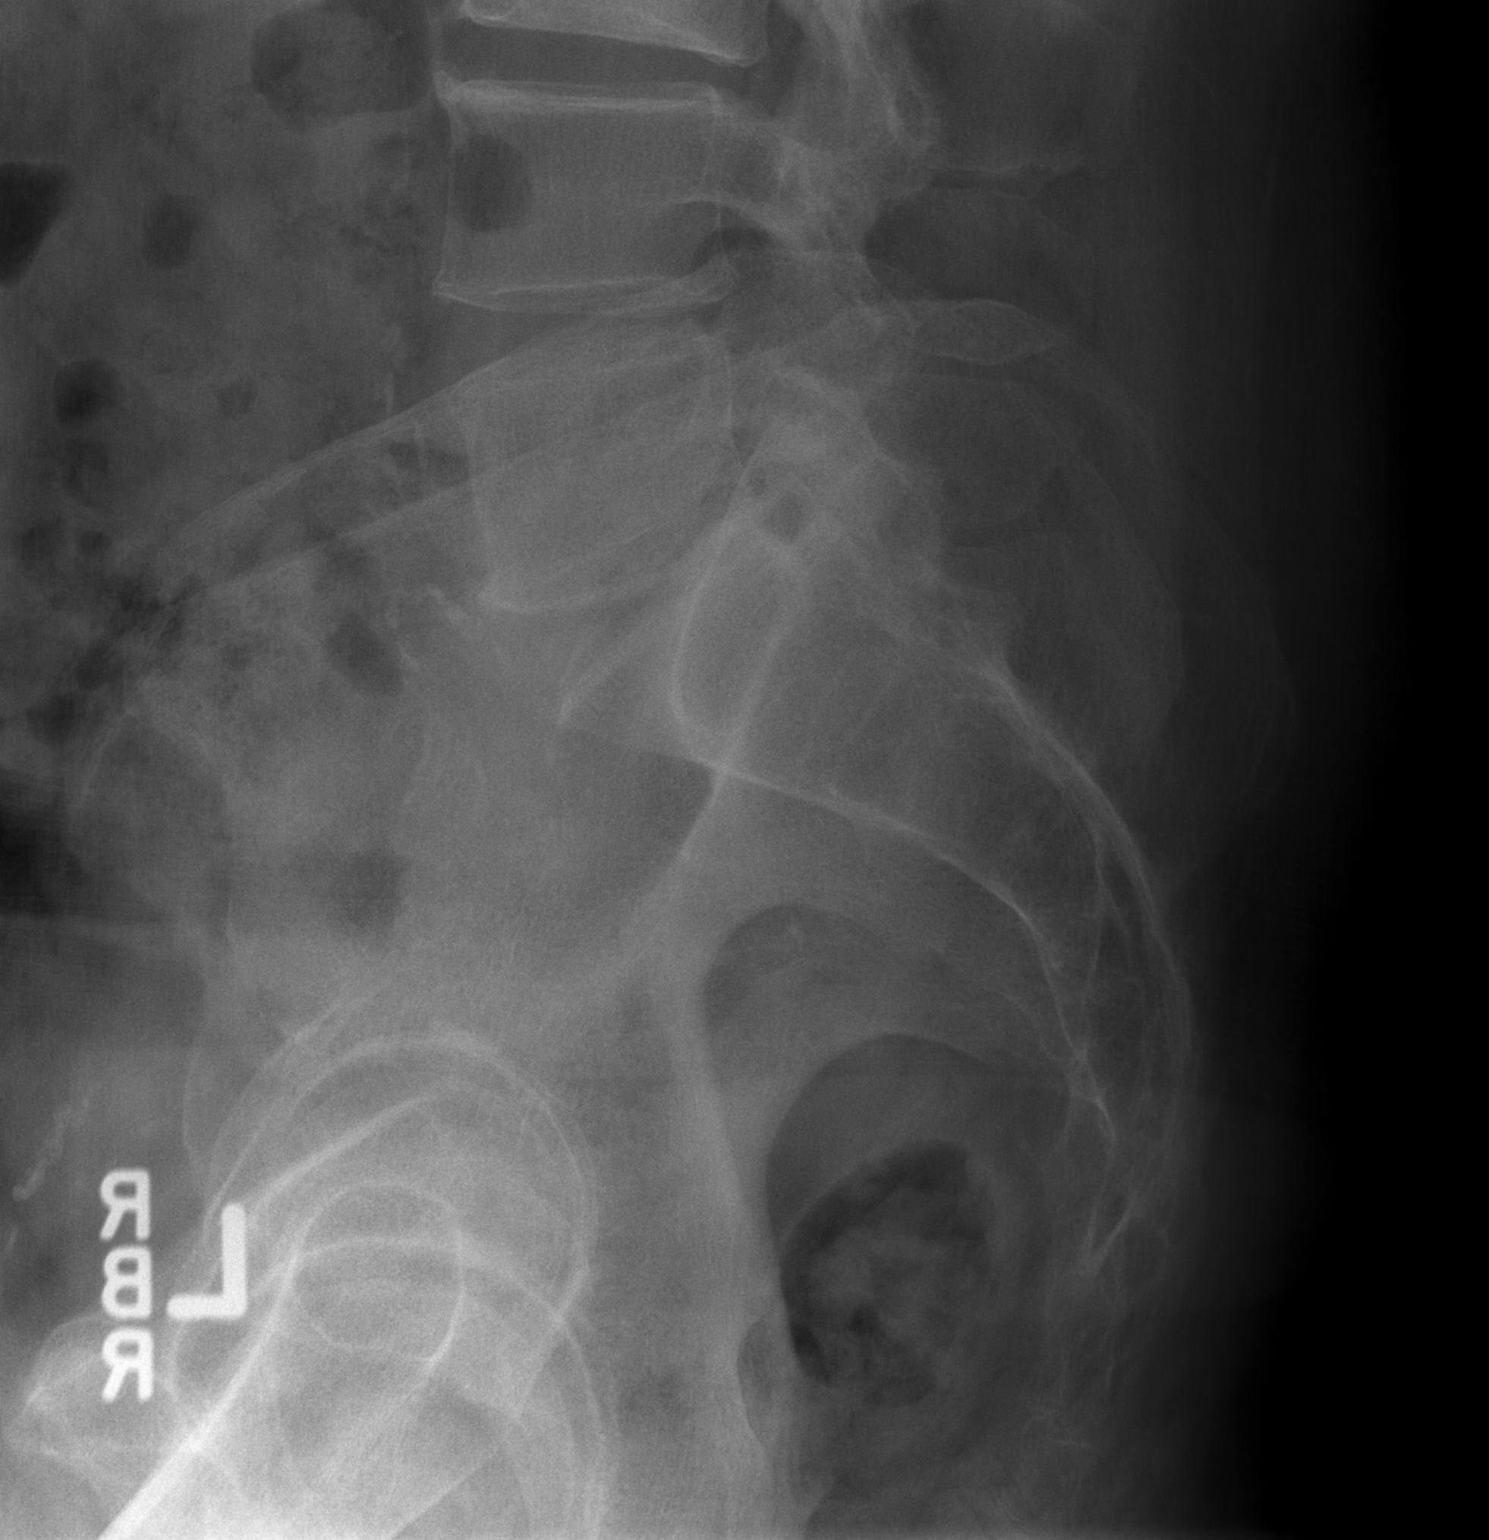

[3 of 3 positions shown; findings below may reference images not displayed]

FINDINGS: Alignment is anatomic. Slight compression of the L1 and L2 superior
endplates, likely old. Secondary degenerative disc disease at T12-L1
and L1-2. Loss of disc space height and mild endplate degenerative
changes at L5-S1. Associated facet hypertrophy.
IMPRESSION: 1. L5-S1 degenerative disc disease with loss of disc space height
and facet hypertrophy.
2. T12-L1 and L1-2 degenerative disc disease.
3. Mild compression of the L1 and L2 superior endplates, likely old.

## 2023-01-24 ENCOUNTER — Other Ambulatory Visit: Payer: Self-pay | Admitting: Medical

## 2023-02-09 ENCOUNTER — Ambulatory Visit: Payer: Managed Care, Other (non HMO) | Admitting: Medical

## 2023-02-09 VITALS — BP 138/78 | HR 78 | Temp 97.7°F | Resp 18 | Ht 74.0 in | Wt 264.4 lb

## 2023-02-09 DIAGNOSIS — F419 Anxiety disorder, unspecified: Secondary | ICD-10-CM

## 2023-02-09 DIAGNOSIS — Z125 Encounter for screening for malignant neoplasm of prostate: Secondary | ICD-10-CM | POA: Diagnosis not present

## 2023-02-09 DIAGNOSIS — F32A Depression, unspecified: Secondary | ICD-10-CM

## 2023-02-09 DIAGNOSIS — Z0000 Encounter for general adult medical examination without abnormal findings: Secondary | ICD-10-CM
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
## 2023-02-09 NOTE — Patient Instructions (Addendum)
For you wellness exam today I have ordered cbc, psa, cmp and lipid panel.  Fill out biometric form when labs back.  Td recommend but declined. If you change mind just call and schedule nurse visit. Also offered shingrix vaccine but declined.   Recommend exercise and healthy diet.  We will let you know lab results as they come in.  Follow up date appointment will be determined after lab review.    Continue current meds for chronic conditions. Reviewed specialist notes today.  Depression and anxiety- phq-9 score 21. Gad-7 score-19. Pt failed sertraline in past rx'd by neurologist. Apparent gi side effects per wife. He declines other meds. Will follow and offer other med if he worsens and is willing to try.  Preventive Care 20-78 Years Old, Male Preventive care refers to lifestyle choices and visits with your health care provider that can promote health and wellness. Preventive care visits are also called wellness exams. What can I expect for my preventive care visit? Counseling During your preventive care visit, your health care provider may ask about your: Medical history, including: Past medical problems. Family medical history. Current health, including: Emotional well-being. Home life and relationship well-being. Sexual activity. Lifestyle, including: Alcohol, nicotine or tobacco, and drug use. Access to firearms. Diet, exercise, and sleep habits. Safety issues such as seatbelt and bike helmet use. Sunscreen use. Work and work Statistician. Physical exam Your health care provider will check your: Height and weight. These may be used to calculate your BMI (body mass index). BMI is a measurement that tells if you are at a healthy weight. Waist circumference. This measures the distance around your waistline. This measurement also tells if you are at a healthy weight and may help predict your risk of certain diseases, such as type 2 diabetes and high blood pressure. Heart rate and  blood pressure. Body temperature. Skin for abnormal spots. What immunizations do I need?  Vaccines are usually given at various ages, according to a schedule. Your health care provider will recommend vaccines for you based on your age, medical history, and lifestyle or other factors, such as travel or where you work. What tests do I need? Screening Your health care provider may recommend screening tests for certain conditions. This may include: Lipid and cholesterol levels. Diabetes screening. This is done by checking your blood sugar (glucose) after you have not eaten for a while (fasting). Hepatitis B test. Hepatitis C test. HIV (human immunodeficiency virus) test. STI (sexually transmitted infection) testing, if you are at risk. Lung cancer screening. Prostate cancer screening. Colorectal cancer screening. Talk with your health care provider about your test results, treatment options, and if necessary, the need for more tests. Follow these instructions at home: Eating and drinking  Eat a diet that includes fresh fruits and vegetables, whole grains, lean protein, and low-fat dairy products. Take vitamin and mineral supplements as recommended by your health care provider. Do not drink alcohol if your health care provider tells you not to drink. If you drink alcohol: Limit how much you have to 0-2 drinks a day. Know how much alcohol is in your drink. In the [place] one drink equals one 12 oz bottle of beer (355 mL), one 5 oz glass of wine (148 mL), or one 1 oz glass of hard liquor (44 mL). Lifestyle Brush your teeth every morning and night with fluoride toothpaste. Floss one time each day. Exercise for at least 30 minutes 5 or more days each week. Do not use any products  that contain nicotine or tobacco. These products include cigarettes, chewing tobacco, and vaping devices, such as e-cigarettes. If you need help quitting, ask your health care provider. Do not use drugs. If you are  sexually active, practice safe sex. Use a condom or other form of protection to prevent STIs. Take aspirin only as told by your health care provider. Make sure that you understand how much to take and what form to take. Work with your health care provider to find out whether it is safe and beneficial for you to take aspirin daily. Find healthy ways to manage stress, such as: Meditation, yoga, or listening to music. Journaling. Talking to a trusted person. Spending time with friends and family. Minimize exposure to UV radiation to reduce your risk of skin cancer. Safety Always wear your seat belt while driving or riding in a vehicle. Do not drive: If you have been drinking alcohol. Do not ride with someone who has been drinking. When you are tired or distracted. While texting. If you have been using any mind-altering substances or drugs. Wear a helmet and other protective equipment during sports activities. If you have firearms in your house, make sure you follow all gun safety procedures. What's next? Go to your health care provider once a year for an annual wellness visit. Ask your health care provider how often you should have your eyes and teeth checked. Stay up to date on all vaccines. This information is not intended to replace advice given to you by your health care provider. Make sure you discuss any questions you have with your health care provider. Document Revised: 05/13/2021 Document Reviewed: 05/13/2021 Elsevier Patient Education  [place].

## 2023-02-10 LAB — CBC WITH DIFFERENTIAL/PLATELET
Basophils Absolute: 0.1 10*3/uL (ref 0.0–0.1)
Basophils Relative: 1.1 % (ref 0.0–3.0)
Eosinophils Absolute: 0.2 10*3/uL (ref 0.0–0.7)
Eosinophils Relative: 1.6 % (ref 0.0–5.0)
HCT: 47.4 % (ref 39.0–52.0)
Hemoglobin: 16.2 g/dL (ref 13.0–17.0)
Lymphocytes Relative: 26.4 % (ref 12.0–46.0)
Lymphs Abs: 2.6 10*3/uL (ref 0.7–4.0)
MCHC: 34.2 g/dL (ref 30.0–36.0)
MCV: 90.7 fl (ref 78.0–100.0)
Monocytes Absolute: 0.8 10*3/uL (ref 0.1–1.0)
Monocytes Relative: 7.6 % (ref 3.0–12.0)
Neutro Abs: 6.3 10*3/uL (ref 1.4–7.7)
Neutrophils Relative %: 63.3 % (ref 43.0–77.0)
Platelets: 401 10*3/uL — ABNORMAL HIGH (ref 150.0–400.0)
RBC: 5.23 Mil/uL (ref 4.22–5.81)
RDW: 13.6 % (ref 11.5–15.5)
WBC: 10 10*3/uL (ref 4.0–10.5)

## 2023-02-10 LAB — COMPREHENSIVE METABOLIC PANEL
ALT: 21 U/L (ref 0–53)
AST: 18 U/L (ref 0–37)
Albumin: 4.6 g/dL (ref 3.5–5.2)
Alkaline Phosphatase: 72 U/L (ref 39–117)
BUN: 9 mg/dL (ref 6–23)
CO2: 26 mEq/L (ref 19–32)
Calcium: 9.8 mg/dL (ref 8.4–10.5)
Chloride: 98 mEq/L (ref 96–112)
Creatinine, Ser: 0.75 mg/dL (ref 0.40–1.50)
GFR: 101.05 mL/min (ref >60.00)
Glucose, Bld: 85 mg/dL (ref 70–99)
Potassium: 5.3 mEq/L — ABNORMAL HIGH (ref 3.5–5.1)
Sodium: 135 mEq/L (ref 135–145)
Total Bilirubin: 0.7 mg/dL (ref 0.2–1.2)
Total Protein: 7.4 g/dL (ref 6.0–8.3)

## 2023-02-10 LAB — PSA: PSA: 0.58 ng/mL (ref 0.10–4.00)

## 2023-02-10 LAB — LIPID PANEL
Cholesterol: 136 mg/dL (ref 0–200)
HDL: 48.7 mg/dL (ref >39.00)
LDL Cholesterol: 54 mg/dL (ref 0–99)
NonHDL: 87.26
Total CHOL/HDL Ratio: 3
Triglycerides: 166 mg/dL — ABNORMAL HIGH (ref 0.0–149.0)
VLDL: 33.2 mg/dL (ref 0.0–40.0)

## 2023-02-11 MED ORDER — SODIUM POLYSTYRENE SULFONATE 15 GM/60ML PO SUSP: ORAL | 0 refills | Status: DC

## 2023-02-11 NOTE — Addendum Note (Signed)
Addended by: Anabel Halon on: 02/11/2023 12:32 PM   Modules accepted: Orders

## 2023-04-04 ENCOUNTER — Other Ambulatory Visit: Payer: Self-pay | Admitting: Medical

## 2023-05-30 ENCOUNTER — Other Ambulatory Visit: Payer: Self-pay

## 2023-05-30 MED ORDER — PANTOPRAZOLE SODIUM 40 MG PO TBEC: 40.0000 mg | DELAYED_RELEASE_TABLET | Freq: Every day | ORAL | 0 refills | Status: DC

## 2023-05-30 NOTE — Telephone Encounter (Signed)
Pantoprazole refilled , patient up to date on office visits.

## 2023-06-09 ENCOUNTER — Other Ambulatory Visit: Payer: Self-pay | Admitting: Medical

## 2023-07-26 ENCOUNTER — Other Ambulatory Visit: Payer: Self-pay | Admitting: Medical

## 2023-10-10 ENCOUNTER — Other Ambulatory Visit: Payer: Self-pay

## 2023-10-10 MED ORDER — PANTOPRAZOLE SODIUM 40 MG PO TBEC: 40.0000 mg | DELAYED_RELEASE_TABLET | Freq: Every day | ORAL | 0 refills | Status: DC

## 2024-01-24 ENCOUNTER — Encounter: Payer: Self-pay | Admitting: Adult Health

## 2024-01-24 ENCOUNTER — Ambulatory Visit (INDEPENDENT_AMBULATORY_CARE_PROVIDER_SITE_OTHER): Payer: Managed Care, Other (non HMO) | Admitting: Adult Health

## 2024-01-24 VITALS — BP 141/90 | HR 82 | Ht 74.0 in | Wt 263.0 lb

## 2024-01-24 DIAGNOSIS — I6389 Other cerebral infarction: Secondary | ICD-10-CM

## 2024-01-24 DIAGNOSIS — G3185 Corticobasal degeneration: Secondary | ICD-10-CM

## 2024-01-24 DIAGNOSIS — R4189 Other symptoms and signs involving cognitive functions and awareness: Secondary | ICD-10-CM | POA: Diagnosis not present

## 2024-01-24 DIAGNOSIS — I6523 Occlusion and stenosis of bilateral carotid arteries: Secondary | ICD-10-CM | POA: Diagnosis not present

## 2024-01-24 DIAGNOSIS — G43109 Migraine with aura, not intractable, without status migrainosus: Secondary | ICD-10-CM

## 2024-01-24 MED ORDER — AJOVY 225 MG/1.5ML ~~LOC~~ SOAJ: 225.0000 mg | SUBCUTANEOUS | 11 refills | Status: DC
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
## 2024-01-24 NOTE — Patient Instructions (Addendum)
 Your Plan:  Will try to get Ajovy approved - please let me know if you are being told that insurance will not cover   Referral placed to an academic center for further evaluation      Follow up with Dr. Marjory Lies in 6 months or call earlier if needed     Thank you for coming to see [place] at [facility]. I hope we have been able to provide you high quality care today.  You may receive a patient satisfaction survey over the next few weeks. We would appreciate your feedback and comments so that we may continue to improve ourselves and the health of our patients.

## 2024-01-25 ENCOUNTER — Telehealth: Payer: Self-pay | Admitting: *Deleted

## 2024-01-25 ENCOUNTER — Telehealth: Payer: Self-pay | Admitting: Neurology

## 2024-01-25 ENCOUNTER — Telehealth: Payer: Self-pay | Admitting: Adult Health

## 2024-01-25 NOTE — Telephone Encounter (Signed)
 [place] faxed on 01/25/2024

## 2024-01-25 NOTE — Telephone Encounter (Signed)
 Referral for neurology fax to [facility] to [facility] in [place]. Phone: [phone], Fax: [phone]

## 2024-01-25 NOTE — Telephone Encounter (Signed)
 Received forms to be updated and completed for the patient. Completed after visit from yesterday and provided to medical records to be sent off.

## 2024-02-01 ENCOUNTER — Ambulatory Visit (HOSPITAL_COMMUNITY)
Admission: RE | Admit: 2024-02-01 | Discharge: 2024-02-01 | Disposition: A | Discharge status: Home / Self Care | Payer: Managed Care, Other (non HMO) | Source: Ambulatory Visit | Attending: Adult Health | Admitting: Adult Health

## 2024-02-01 DIAGNOSIS — I6523 Occlusion and stenosis of bilateral carotid arteries: Secondary | ICD-10-CM | POA: Diagnosis not present

## 2024-02-02 ENCOUNTER — Encounter: Payer: Self-pay | Admitting: Adult Health

## 2024-02-05 ENCOUNTER — Encounter: Payer: Self-pay | Admitting: Emergency Medicine

## 2024-02-10 ENCOUNTER — Telehealth: Payer: Self-pay | Admitting: Adult Health

## 2024-02-10 NOTE — Telephone Encounter (Signed)
 Pt's wife called wanting to know what the update is on the pt's PA for his Fremanezumab-vfrm (AJOVY) 225 MG/1.5ML SOAJ  She states that the pt has not been able to start the medication due to the pharmacy requesting the PA. Please advise.

## 2024-02-13 NOTE — Telephone Encounter (Signed)
 From what I can tell, we never were sent a request from pharmacy and I am unable to see where one has been completed. Completed a PA through [facility] [id number] Will await determination can take 72-120 hours for response

## 2024-02-20 ENCOUNTER — Ambulatory Visit (INDEPENDENT_AMBULATORY_CARE_PROVIDER_SITE_OTHER): Payer: Managed Care, Other (non HMO) | Admitting: Medical

## 2024-02-20 VITALS — BP 120/70 | HR 77 | Temp 98.2°F | Resp 18 | Ht 74.0 in | Wt 260.4 lb

## 2024-02-20 DIAGNOSIS — Z0000 Encounter for general adult medical examination without abnormal findings: Secondary | ICD-10-CM

## 2024-02-20 DIAGNOSIS — Z122 Encounter for screening for malignant neoplasm of respiratory organs: Secondary | ICD-10-CM | POA: Diagnosis not present

## 2024-02-20 DIAGNOSIS — Z125 Encounter for screening for malignant neoplasm of prostate: Secondary | ICD-10-CM

## 2024-02-20 DIAGNOSIS — F17200 Nicotine dependence, unspecified, uncomplicated: Secondary | ICD-10-CM | POA: Diagnosis not present

## 2024-02-20 DIAGNOSIS — E875 Hyperkalemia: Secondary | ICD-10-CM

## 2024-02-20 NOTE — Patient Instructions (Addendum)
 For you wellness exam today I have ordered cbc, cmp, psa and lipid panel.  Vaccine declined.  Recommend exercise and healthy diet.  We will let you know lab results as they come in.  Follow up date appointment will be determined after lab review.    Pt declines counseling and meds. If every active thought harm to self be seen in the ED. If you change mind on counseling or meds let me know.     Preventive Care 46-58 Years Old, Male Preventive care refers to lifestyle choices and visits with your health care provider that can promote health and wellness. Preventive care visits are also called wellness exams. What can I expect for my preventive care visit? Counseling During your preventive care visit, your health care provider may ask about your: Medical history, including: Past medical problems. Family medical history. Current health, including: Emotional well-being. Home life and relationship well-being. Sexual activity. Lifestyle, including: Alcohol, nicotine or tobacco, and drug use. Access to firearms. Diet, exercise, and sleep habits. Safety issues such as seatbelt and bike helmet use. Sunscreen use. Work and work Astronomer. Physical exam Your health care provider will check your: Height and weight. These may be used to calculate your BMI (body mass index). BMI is a measurement that tells if you are at a healthy weight. Waist circumference. This measures the distance around your waistline. This measurement also tells if you are at a healthy weight and may help predict your risk of certain diseases, such as type 2 diabetes and high blood pressure. Heart rate and blood pressure. Body temperature. Skin for abnormal spots. What immunizations do I need?  Vaccines are usually given at various ages, according to a schedule. Your health care provider will recommend vaccines for you based on your age, medical history, and lifestyle or other factors, such as travel or where you  work. What tests do I need? Screening Your health care provider may recommend screening tests for certain conditions. This may include: Lipid and cholesterol levels. Diabetes screening. This is done by checking your blood sugar (glucose) after you have not eaten for a while (fasting). Hepatitis B test. Hepatitis C test. HIV (human immunodeficiency virus) test. STI (sexually transmitted infection) testing, if you are at risk. Lung cancer screening. Prostate cancer screening. Colorectal cancer screening. Talk with your health care provider about your test results, treatment options, and if necessary, the need for more tests. Follow these instructions at home: Eating and drinking  Eat a diet that includes fresh fruits and vegetables, whole grains, lean protein, and low-fat dairy products. Take vitamin and mineral supplements as recommended by your health care provider. Do not drink alcohol if your health care provider tells you not to drink. If you drink alcohol: Limit how much you have to 0-2 drinks a day. Know how much alcohol is in your drink. In the [place] one drink equals one 12 oz bottle of beer (355 mL), one 5 oz glass of wine (148 mL), or one 1 oz glass of hard liquor (44 mL). Lifestyle Brush your teeth every morning and night with fluoride toothpaste. Floss one time each day. Exercise for at least 30 minutes 5 or more days each week. Do not use any products that contain nicotine or tobacco. These products include cigarettes, chewing tobacco, and vaping devices, such as e-cigarettes. If you need help quitting, ask your health care provider. Do not use drugs. If you are sexually active, practice safe sex. Use a condom or other form of protection  to prevent STIs. Take aspirin only as told by your health care provider. Make sure that you understand how much to take and what form to take. Work with your health care provider to find out whether it is safe and beneficial for you to take  aspirin daily. Find healthy ways to manage stress, such as: Meditation, yoga, or listening to music. Journaling. Talking to a trusted person. Spending time with friends and family. Minimize exposure to UV radiation to reduce your risk of skin cancer. Safety Always wear your seat belt while driving or riding in a vehicle. Do not drive: If you have been drinking alcohol. Do not ride with someone who has been drinking. When you are tired or distracted. While texting. If you have been using any mind-altering substances or drugs. Wear a helmet and other protective equipment during sports activities. If you have firearms in your house, make sure you follow all gun safety procedures. What's next? Go to your health care provider once a year for an annual wellness visit. Ask your health care provider how often you should have your eyes and teeth checked. Stay up to date on all vaccines. This information is not intended to replace advice given to you by your health care provider. Make sure you discuss any questions you have with your health care provider. Document Revised: 05/13/2021 Document Reviewed: 05/13/2021 Elsevier Patient Education  2024 [employer].
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
## 2024-02-21 LAB — COMPREHENSIVE METABOLIC PANEL WITH GFR
ALT: 22 U/L (ref 0–53)
AST: 19 U/L (ref 0–37)
Albumin: 4.8 g/dL (ref 3.5–5.2)
Alkaline Phosphatase: 81 U/L (ref 39–117)
BUN: 7 mg/dL (ref 6–23)
CO2: 28 meq/L (ref 19–32)
Calcium: 10.1 mg/dL (ref 8.4–10.5)
Chloride: 100 meq/L (ref 96–112)
Creatinine, Ser: 0.68 mg/dL (ref 0.40–1.50)
GFR: 103.33 mL/min
Glucose, Bld: 92 mg/dL (ref 70–99)
Potassium: 5.3 meq/L — ABNORMAL HIGH (ref 3.5–5.1)
Sodium: 138 meq/L (ref 135–145)
Total Bilirubin: 0.7 mg/dL (ref 0.2–1.2)
Total Protein: 7.3 g/dL (ref 6.0–8.3)

## 2024-02-21 LAB — CBC WITH DIFFERENTIAL/PLATELET
Basophils Absolute: 0.1 K/uL (ref 0.0–0.1)
Basophils Relative: 1.2 % (ref 0.0–3.0)
Eosinophils Absolute: 0.2 K/uL (ref 0.0–0.7)
Eosinophils Relative: 1.6 % (ref 0.0–5.0)
HCT: 47.2 % (ref 39.0–52.0)
Hemoglobin: 16.2 g/dL (ref 13.0–17.0)
Lymphocytes Relative: 25.8 % (ref 12.0–46.0)
Lymphs Abs: 2.5 K/uL (ref 0.7–4.0)
MCHC: 34.2 g/dL (ref 30.0–36.0)
MCV: 92 fl (ref 78.0–100.0)
Monocytes Absolute: 0.7 K/uL (ref 0.1–1.0)
Monocytes Relative: 7.1 % (ref 3.0–12.0)
Neutro Abs: 6.3 K/uL (ref 1.4–7.7)
Neutrophils Relative %: 64.3 % (ref 43.0–77.0)
Platelets: 390 K/uL (ref 150.0–400.0)
RBC: 5.14 Mil/uL (ref 4.22–5.81)
RDW: 13.3 % (ref 11.5–15.5)
WBC: 9.9 K/uL (ref 4.0–10.5)

## 2024-02-21 LAB — LIPID PANEL
Cholesterol: 163 mg/dL (ref 0–200)
HDL: 46.9 mg/dL (ref >39.00)
LDL Cholesterol: 89 mg/dL (ref 0–99)
NonHDL: 115.62
Total CHOL/HDL Ratio: 3
Triglycerides: 134 mg/dL (ref 0.0–149.0)
VLDL: 26.8 mg/dL (ref 0.0–40.0)

## 2024-02-21 LAB — PSA: PSA: 0.58 ng/mL (ref 0.10–4.00)

## 2024-02-22 ENCOUNTER — Encounter: Payer: Self-pay | Admitting: Medical

## 2024-02-22 ENCOUNTER — Other Ambulatory Visit: Payer: Self-pay

## 2024-02-22 ENCOUNTER — Other Ambulatory Visit (HOSPITAL_BASED_OUTPATIENT_CLINIC_OR_DEPARTMENT_OTHER): Payer: Self-pay

## 2024-02-22 MED ORDER — ROSUVASTATIN CALCIUM 20 MG PO TABS
20.0000 mg | ORAL_TABLET | Freq: Every day | ORAL | 3 refills | Status: DC
Start: 2024-02-22 — End: 2024-07-24

## 2024-02-22 MED ORDER — SODIUM POLYSTYRENE SULFONATE 15 GM/60ML CO SUSP
15.0000 g | Freq: Every day | 0 refills | Status: DC
  Filled 2024-02-22: qty 240, 4d supply, fill #0

## 2024-02-22 MED ORDER — ROSUVASTATIN CALCIUM 20 MG PO TABS: 20.0000 mg | ORAL_TABLET | Freq: Every day | ORAL | 3 refills | Status: DC

## 2024-02-22 NOTE — Addendum Note (Signed)
 Addended by: Gwenevere Abbot on: 02/22/2024 06:35 AM   Modules accepted: Orders

## 2024-02-24 ENCOUNTER — Ambulatory Visit

## 2024-02-24 DIAGNOSIS — F17210 Nicotine dependence, cigarettes, uncomplicated: Secondary | ICD-10-CM

## 2024-02-24 DIAGNOSIS — Z122 Encounter for screening for malignant neoplasm of respiratory organs: Secondary | ICD-10-CM | POA: Diagnosis not present

## 2024-02-24 DIAGNOSIS — F17200 Nicotine dependence, unspecified, uncomplicated: Secondary | ICD-10-CM

## 2024-03-01 ENCOUNTER — Telehealth: Payer: Self-pay | Admitting: Medical

## 2024-03-01 NOTE — Telephone Encounter (Signed)
 Pt notified via mychart

## 2024-03-01 NOTE — Telephone Encounter (Signed)
 Forms sent to company and sent to scan

## 2024-03-01 NOTE — Telephone Encounter (Signed)
 Copied from CRM [id number]. Topic: Clinical - Prescription Issue >> Mar 01, 2024 12:58 PM [place] J wrote: Reason for CRM: Patient wanted to notify provider that he will no longer be taking his sodium polystyrene (KAYEXALATE) 15 GM/60ML due to seeing no change when he was previously taking it.

## 2024-03-01 NOTE — Telephone Encounter (Signed)
 Copied from CRM [id number]. Topic: Medical Record Request - Other >> Mar 01, 2024 12:58 PM Lee Andrews wrote: Reason for CRM: Patient gave clinic forms that needed to be sent to [facility]. The forms are for biometric screening. Patient would like to be updated when that is completed.

## 2024-03-05 ENCOUNTER — Other Ambulatory Visit (HOSPITAL_BASED_OUTPATIENT_CLINIC_OR_DEPARTMENT_OTHER): Payer: Self-pay

## 2024-03-07 ENCOUNTER — Telehealth: Payer: Self-pay

## 2024-03-07 NOTE — Telephone Encounter (Signed)
 Spoke with Diane at Read room, stated imaging result in finalization stage and possible pending status, and will ask to get read soon.  Pt called and lvm to return call to notify pt of reading room delay and results should hopefully be in by end of week  Copied from CRM [id number]. Topic: Clinical - Lab/Test Results >> Mar 06, 2024  4:29 PM Elizebeth Brooking wrote: Reason for CRM: Patient wife called in wanting to discuss his ct chest scans , would like for someone to call him back regarding this  [phone]

## 2024-03-09 ENCOUNTER — Encounter: Payer: Self-pay | Admitting: Medical

## 2024-04-25 ENCOUNTER — Telehealth: Payer: Self-pay

## 2024-04-25 NOTE — Telephone Encounter (Signed)
 Received form from [employer]. Sent mychart message regarding form fee.

## 2024-04-27 ENCOUNTER — Telehealth: Payer: Self-pay

## 2024-04-27 NOTE — Telephone Encounter (Signed)
 LTD received ,  Spoke with wife stated they are in [place]  for 6 weeks, made her aware virtual visits are not allowed outside of [place]. Wife believes work wants paperwork signed from every speciality even though neurology has reached out to them as well. Will file LTD until appointment scheduled

## 2024-05-30 NOTE — Telephone Encounter (Signed)
 Followed back on regarding form. Spoke with patients wife who advised that she has left two voicemail's for our office and no one has called her back. Stated that she spoke with [employer] who advised that she shouldn't have to make any payment and that a form needs to be sent to [facility] faxed request for form fee.

## 2024-06-13 DIAGNOSIS — Z0289 Encounter for other administrative examinations: Secondary | ICD-10-CM

## 2024-06-20 NOTE — Telephone Encounter (Signed)
 Check was received 7/16-forms given to POD 3 to review

## 2024-06-22 ENCOUNTER — Other Ambulatory Visit: Payer: Self-pay

## 2024-06-22 MED ORDER — PANTOPRAZOLE SODIUM 40 MG PO TBEC: 40.0000 mg | DELAYED_RELEASE_TABLET | Freq: Every day | ORAL | 2 refills | Status: AC

## 2024-06-25 NOTE — Telephone Encounter (Signed)
 Form completed and signed, placed in MR for pick up

## 2024-06-26 ENCOUNTER — Telehealth: Payer: Self-pay | Admitting: *Deleted

## 2024-06-26 NOTE — Telephone Encounter (Signed)
 Pt [place]  [employer] form faxed on 06/26/2024

## 2024-07-05 ENCOUNTER — Telehealth: Payer: Self-pay

## 2024-07-05 NOTE — Telephone Encounter (Signed)
 Copied from CRM [id number]. Topic: General - Other >> Jul 05, 2024 12:17 PM Mercedes MATSU wrote: Reason for CRM: Patient called in requesting that his fasting glucose results be added to her Biometric wellness form because it was missing and he needs to turn the forms in. Patient is requesting a call back once forms are updated and office can resend back to number on form which is the insurance company. Patient can be reached at [phone].

## 2024-07-05 NOTE — Telephone Encounter (Signed)
 Form faxed   Lvm on wife telephone number to notify them of forms being faxed

## 2024-07-24 ENCOUNTER — Encounter: Payer: Self-pay | Admitting: Diagnostic Neuroimaging

## 2024-07-24 ENCOUNTER — Ambulatory Visit (INDEPENDENT_AMBULATORY_CARE_PROVIDER_SITE_OTHER): Payer: Managed Care, Other (non HMO) | Admitting: Diagnostic Neuroimaging

## 2024-07-24 VITALS — BP 160/90 | HR 84 | Ht 73.0 in | Wt 252.8 lb

## 2024-07-24 DIAGNOSIS — G43109 Migraine with aura, not intractable, without status migrainosus: Secondary | ICD-10-CM | POA: Diagnosis not present

## 2024-07-24 DIAGNOSIS — G3185 Corticobasal degeneration: Secondary | ICD-10-CM | POA: Diagnosis not present

## 2024-07-24 DIAGNOSIS — R259 Unspecified abnormal involuntary movements: Secondary | ICD-10-CM

## 2024-07-24 DIAGNOSIS — R4189 Other symptoms and signs involving cognitive functions and awareness: Secondary | ICD-10-CM | POA: Diagnosis not present
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
## 2024-07-24 NOTE — Patient Instructions (Signed)
 HEADACHES (migraine with aura) - STOP ajovy  monthly injections (not effective) - consider topiramate, amitriptyline or sertraline  - consider ubrelvy  as needed (not tried yet); avoid triptans - consider zofran  as needed for nausea

## 2024-09-03 ENCOUNTER — Other Ambulatory Visit: Payer: Self-pay | Admitting: Medical
